# Patient Record
Sex: Male | Born: 1970 | Race: White | Hispanic: Yes | Marital: Married | Smoking: Never smoker
Health system: Southern US, Community
[De-identification: ages and names within clinical notes are randomized; demographics above are authoritative.]

## PROBLEM LIST (undated history)

## (undated) DIAGNOSIS — F419 Anxiety disorder, unspecified: Secondary | ICD-10-CM

## (undated) DIAGNOSIS — R51 Headache: Secondary | ICD-10-CM

## (undated) DIAGNOSIS — Z789 Other specified health status: Secondary | ICD-10-CM

## (undated) HISTORY — PX: NO PAST SURGERIES: SHX2092

---

## 2006-04-01 ENCOUNTER — Emergency Department (HOSPITAL_COMMUNITY): Admission: EM | Admit: 2006-04-01 | Discharge: 2006-04-01 | Payer: Self-pay | Admitting: Emergency Medicine

## 2008-03-07 ENCOUNTER — Emergency Department (HOSPITAL_COMMUNITY): Admission: EM | Admit: 2008-03-07 | Discharge: 2008-03-07 | Payer: Self-pay | Admitting: Emergency Medicine

## 2010-04-11 ENCOUNTER — Emergency Department (HOSPITAL_COMMUNITY): Admission: EM | Admit: 2010-04-11 | Discharge: 2010-04-11 | Payer: Self-pay | Admitting: Emergency Medicine

## 2010-10-06 LAB — DIFFERENTIAL
Lymphocytes Relative: 9 % — ABNORMAL LOW (ref 12–46)
Lymphs Abs: 0.9 10*3/uL (ref 0.7–4.0)
Monocytes Relative: 4 % (ref 3–12)
Neutro Abs: 8.7 10*3/uL — ABNORMAL HIGH (ref 1.7–7.7)
Neutrophils Relative %: 86 % — ABNORMAL HIGH (ref 43–77)

## 2010-10-06 LAB — COMPREHENSIVE METABOLIC PANEL
ALT: 20 U/L (ref 0–53)
CO2: 29 mEq/L (ref 19–32)
Calcium: 9.2 mg/dL (ref 8.4–10.5)
Creatinine, Ser: 0.93 mg/dL (ref 0.4–1.5)
GFR calc Af Amer: >60 mL/min (ref >60)
GFR calc non Af Amer: >60 mL/min (ref >60)
Glucose, Bld: 122 mg/dL — ABNORMAL HIGH (ref 70–99)
Sodium: 136 mEq/L (ref 135–145)
Total Protein: 7.3 g/dL (ref 6.0–8.3)

## 2010-10-06 LAB — CBC
MCV: 89.8 fL (ref 78.0–100.0)
RBC: 4.98 MIL/uL (ref 4.22–5.81)
RDW: 12.8 % (ref 11.5–15.5)
WBC: 10 10*3/uL (ref 4.0–10.5)

## 2010-10-06 LAB — LIPASE, BLOOD: Lipase: 32 U/L (ref 11–59)

## 2011-06-21 ENCOUNTER — Encounter: Payer: Self-pay | Admitting: *Deleted

## 2011-06-21 ENCOUNTER — Emergency Department (HOSPITAL_COMMUNITY)
Admission: EM | Admit: 2011-06-21 | Discharge: 2011-06-21 | Disposition: A | Discharge status: Home / Self Care | Payer: No Typology Code available for payment source | Attending: Emergency Medicine | Admitting: Emergency Medicine

## 2011-06-21 ENCOUNTER — Emergency Department (HOSPITAL_COMMUNITY): Payer: No Typology Code available for payment source

## 2011-06-21 DIAGNOSIS — M545 Low back pain, unspecified: Secondary | ICD-10-CM | POA: Insufficient documentation

## 2011-06-21 DIAGNOSIS — S139XXA Sprain of joints and ligaments of unspecified parts of neck, initial encounter: Secondary | ICD-10-CM | POA: Insufficient documentation

## 2011-06-21 DIAGNOSIS — S161XXA Strain of muscle, fascia and tendon at neck level, initial encounter: Secondary | ICD-10-CM

## 2011-06-21 DIAGNOSIS — M25569 Pain in unspecified knee: Secondary | ICD-10-CM | POA: Insufficient documentation

## 2011-06-21 DIAGNOSIS — M25519 Pain in unspecified shoulder: Secondary | ICD-10-CM | POA: Insufficient documentation

## 2011-06-21 DIAGNOSIS — T07XXXA Unspecified multiple injuries, initial encounter: Secondary | ICD-10-CM | POA: Insufficient documentation

## 2011-06-21 DIAGNOSIS — M542 Cervicalgia: Secondary | ICD-10-CM | POA: Insufficient documentation

## 2011-06-21 MED ORDER — CYCLOBENZAPRINE HCL 10 MG PO TABS: 10.0000 mg | ORAL_TABLET | Freq: Three times a day (TID) | ORAL | Status: AC | PRN

## 2011-06-21 MED ORDER — IBUPROFEN 800 MG PO TABS
800.0000 mg | ORAL_TABLET | Freq: Once | ORAL | Status: AC
  Administered 2011-06-21: 800 mg via ORAL
  Filled 2011-06-21: qty 1

## 2011-06-21 MED ORDER — HYDROCODONE-ACETAMINOPHEN 5-325 MG PO TABS: ORAL_TABLET | ORAL | Status: AC

## 2011-06-21 MED ORDER — OXYCODONE-ACETAMINOPHEN 5-325 MG PO TABS
1.0000 | ORAL_TABLET | Freq: Once | ORAL | Status: AC
  Administered 2011-06-21: 1 via ORAL
  Filled 2011-06-21: qty 1

## 2011-06-21 NOTE — ED Notes (Signed)
Patient remains in xray 
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
## 2011-06-21 NOTE — ED Notes (Signed)
Patient in x ray. Will administer pain medication upon return to ED room.
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
## 2011-06-21 NOTE — ED Notes (Signed)
Patient with no complaints at this time. Respirations even and unlabored. Skin warm/dry. Discharge instructions reviewed with patient at this time. Patient given opportunity to voice concerns/ask questions. Patient discharged at this time and left Emergency Department with steady gait.   

## 2011-06-21 NOTE — ED Notes (Signed)
Patient requesting pain medication. Pauline Aus, PA made aware. Awaiting orders.

## 2011-06-23 NOTE — ED Provider Notes (Signed)
Medical screening examination/treatment/procedure(s) were performed by non-physician practitioner and as supervising physician I was immediately available for consultation/collaboration.   Benny Lennert, MD 06/23/11 [phone]

## 2011-08-24 ENCOUNTER — Encounter (HOSPITAL_COMMUNITY): Payer: Self-pay | Admitting: Emergency Medicine

## 2011-08-24 ENCOUNTER — Emergency Department (HOSPITAL_COMMUNITY)
Admission: EM | Admit: 2011-08-24 | Discharge: 2011-08-24 | Disposition: A | Discharge status: Home / Self Care | Payer: No Typology Code available for payment source | Attending: Emergency Medicine | Admitting: Emergency Medicine

## 2011-08-24 ENCOUNTER — Emergency Department (HOSPITAL_COMMUNITY): Payer: No Typology Code available for payment source

## 2011-08-24 DIAGNOSIS — R0789 Other chest pain: Secondary | ICD-10-CM

## 2011-08-24 DIAGNOSIS — M79609 Pain in unspecified limb: Secondary | ICD-10-CM | POA: Insufficient documentation

## 2011-08-24 DIAGNOSIS — Z87828 Personal history of other (healed) physical injury and trauma: Secondary | ICD-10-CM | POA: Insufficient documentation

## 2011-08-24 DIAGNOSIS — M25519 Pain in unspecified shoulder: Secondary | ICD-10-CM

## 2011-08-24 DIAGNOSIS — R071 Chest pain on breathing: Secondary | ICD-10-CM | POA: Insufficient documentation

## 2011-08-24 MED ORDER — HYDROCODONE-ACETAMINOPHEN 7.5-325 MG PO TABS: 1.0000 | ORAL_TABLET | Freq: Four times a day (QID) | ORAL | Status: AC | PRN

## 2011-08-24 MED ORDER — PREDNISONE 10 MG PO TABS: ORAL_TABLET | ORAL | Status: DC
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
## 2011-08-24 NOTE — ED Notes (Signed)
Return from xray
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
## 2011-08-25 NOTE — ED Provider Notes (Signed)
Medical screening examination/treatment/procedure(s) were performed by non-physician practitioner and as supervising physician I was immediately available for consultation/collaboration.   Glynn Octave, MD 08/25/11 1121

## 2013-03-27 ENCOUNTER — Emergency Department (HOSPITAL_COMMUNITY): Payer: Self-pay

## 2013-03-27 ENCOUNTER — Emergency Department (HOSPITAL_COMMUNITY)
Admission: EM | Admit: 2013-03-27 | Discharge: 2013-03-27 | Disposition: A | Discharge status: Home / Self Care | Payer: Self-pay | Attending: Emergency Medicine | Admitting: Emergency Medicine

## 2013-03-27 ENCOUNTER — Encounter (HOSPITAL_COMMUNITY): Payer: Self-pay | Admitting: Emergency Medicine

## 2013-03-27 DIAGNOSIS — S9782XA Crushing injury of left foot, initial encounter: Secondary | ICD-10-CM

## 2013-03-27 DIAGNOSIS — S9780XA Crushing injury of unspecified foot, initial encounter: Secondary | ICD-10-CM | POA: Insufficient documentation

## 2013-03-27 DIAGNOSIS — W208XXA Other cause of strike by thrown, projected or falling object, initial encounter: Secondary | ICD-10-CM | POA: Insufficient documentation

## 2013-03-27 DIAGNOSIS — Y9289 Other specified places as the place of occurrence of the external cause: Secondary | ICD-10-CM | POA: Insufficient documentation

## 2013-03-27 DIAGNOSIS — Y9389 Activity, other specified: Secondary | ICD-10-CM | POA: Insufficient documentation

## 2013-03-27 MED ORDER — KETOROLAC TROMETHAMINE 10 MG PO TABS
10.0000 mg | ORAL_TABLET | Freq: Once | ORAL | Status: AC
  Administered 2013-03-27: 10 mg via ORAL
  Filled 2013-03-27: qty 1

## 2013-03-27 MED ORDER — OXYCODONE-ACETAMINOPHEN 5-325 MG PO TABS: 1.0000 | ORAL_TABLET | Freq: Four times a day (QID) | ORAL | Status: DC | PRN

## 2013-03-27 MED ORDER — ONDANSETRON HCL 4 MG PO TABS
4.0000 mg | ORAL_TABLET | Freq: Once | ORAL | Status: AC
  Administered 2013-03-27: 4 mg via ORAL
  Filled 2013-03-27: qty 1

## 2013-03-27 MED ORDER — OXYCODONE-ACETAMINOPHEN 5-325 MG PO TABS
1.0000 | ORAL_TABLET | Freq: Once | ORAL | Status: AC
  Administered 2013-03-27: 1 via ORAL
  Filled 2013-03-27: qty 1

## 2013-03-27 NOTE — ED Notes (Addendum)
Patient reports bucket that weighs approximately 300lbs fell from front of tractor onto right foot. Swelling noted.

## 2013-03-27 NOTE — ED Provider Notes (Signed)
CSN: [id number]     Arrival date & time 03/27/13  3086 History   First MD Initiated Contact with Patient 03/27/13 1009     Chief Complaint  Patient presents with  . Foot Injury   (Consider location/radiation/quality/duration/timing/severity/associated sxs/prior Treatment) Patient is a 42 y.o. male presenting with foot injury. The history is provided by the patient.  Foot Injury Location:  Foot Injury: yes (a "300lb" piece of  farm equipment fell on the left foot this morning.)   Mechanism of injury: crush   Crush injury:    Mechanism:  Farm machinery   Duration of crushing force:  2 hours   Approximate weight of object:  300 lbs Foot location:  R foot Pain details:    Quality:  Aching and throbbing   Radiates to:  Does not radiate   Onset quality:  Sudden   Duration:  2 hours   Timing:  Constant   Progression:  Worsening Chronicity:  New Dislocation: no   Foreign body present:  No foreign bodies Prior injury to area:  No Relieved by:  Nothing Worsened by:  Bearing weight Ineffective treatments:  None tried Associated symptoms: swelling and tingling   Associated symptoms: no back pain and no neck pain     History reviewed. No pertinent past medical history. History reviewed. No pertinent past surgical history. History reviewed. No pertinent family history. History  Substance Use Topics  . Smoking status: Never Smoker   . Smokeless tobacco: Not on file  . Alcohol Use: No    Review of Systems  Constitutional: Negative for activity change.       All ROS Neg except as noted in HPI  HENT: Negative for nosebleeds and neck pain.   Eyes: Negative for photophobia and discharge.  Respiratory: Negative for cough, shortness of breath and wheezing.   Cardiovascular: Negative for chest pain and palpitations.  Gastrointestinal: Negative for abdominal pain and blood in stool.  Genitourinary: Negative for dysuria, frequency and hematuria.  Musculoskeletal: Negative for back pain  and arthralgias.  Skin: Negative.   Neurological: Negative for dizziness, seizures and speech difficulty.  Psychiatric/Behavioral: Negative for hallucinations and confusion.    Allergies  Review of patient's allergies indicates no known allergies.  Home Medications   Current Outpatient Rx  Name  Route  Sig  Dispense  Refill  . ibuprofen (ADVIL,MOTRIN) 200 MG tablet   Oral   Take 200 mg by mouth once as needed. For pain         . predniSONE (DELTASONE) 10 MG tablet      Take 6 tablets day one, 5 tablets day two, 4 tablets day three, 3 tablets day four, 2 tablets day five, then 1 tablet day six   21 tablet   0    BP 121/77  Pulse 75  Temp(Src) 98.3 F (36.8 C) (Oral)  Resp 18  Ht 5\' 8"  (1.727 m)  Wt 198 lb (89.812 kg)  BMI 30.11 kg/m2  SpO2 99% Physical Exam  Nursing note and vitals reviewed. Constitutional: He is oriented to person, place, and time. He appears well-developed and well-nourished.  Non-toxic appearance.  HENT:  Head: Normocephalic.  Right Ear: Tympanic membrane and external ear normal.  Left Ear: Tympanic membrane and external ear normal.  Eyes: EOM and lids are normal. Pupils are equal, round, and reactive to light.  Neck: Normal range of motion. Neck supple. Carotid bruit is not present.  Muscle tightness and tenseness from the neck to the upper shoulders.  Cardiovascular: Normal rate, regular rhythm, normal heart sounds, intact distal pulses and normal pulses.   Pulmonary/Chest: Breath sounds normal. No respiratory distress.  Abdominal: Soft. Bowel sounds are normal. There is no tenderness. There is no guarding.  Musculoskeletal: Normal range of motion.  There is Good ROM of the left hip and knee. No tib/fib deformity. Significant swelling of the dorsum of the left foot, and mild to mod swelling of the toes. Achilles intact.   Lymphadenopathy:       Head (right side): No submandibular adenopathy present.       Head (left side): No submandibular  adenopathy present.    He has no cervical adenopathy.  Neurological: He is alert and oriented to person, place, and time. He has normal strength. No cranial nerve deficit or sensory deficit.  Skin: Skin is warm and dry.  Psychiatric: He has a normal mood and affect. His speech is normal.    ED Course  Procedures : BEDSIDE DOPPLER - LEFT LOWER EXTREMITY - Pt identified by arm band. Permission for procedure given by the patient.  Use a bedside doppler, the left DP pulse was identified and followed to the digits. Pt has a biphasic pulse presentation at 76/min. Pt has a triphasic PT pulse presentation. No Temperature changes appreciated. Pt tolerated procedure without problem. Labs Review Labs Reviewed - No data to display Imaging Review No results found.  MDM  No diagnosis found. *I have reviewed nursing notes, vital signs, and all appropriate lab and imaging results for this patient.** Xray of the left foot is negative for fx. Doppler reveals good arterial flow. Pt will elevate the foot, apply ice. He will use ibuprofen and percocet for discomfort. He will return tomorrow morning for recheck. (concern for compartment syndrome) Pt fitted with crutches, and watson-Jones splint and post op shoe.   Kathie Dike, PA-C 03/27/13 1151

## 2013-03-27 NOTE — ED Notes (Signed)
Pt alert & oriented x4, stable gait. Patient given discharge instructions, paperwork & prescription(s). Patient  instructed to stop at the registration desk to finish any additional paperwork. Patient verbalized understanding. Pt left department w/ no further questions. 

## 2013-03-28 ENCOUNTER — Encounter (HOSPITAL_COMMUNITY): Payer: Self-pay | Admitting: Emergency Medicine

## 2013-03-28 ENCOUNTER — Emergency Department (HOSPITAL_COMMUNITY)
Admission: EM | Admit: 2013-03-28 | Discharge: 2013-03-28 | Disposition: A | Discharge status: Home / Self Care | Payer: Self-pay | Attending: Emergency Medicine | Admitting: Emergency Medicine

## 2013-03-28 DIAGNOSIS — S9031XD Contusion of right foot, subsequent encounter: Secondary | ICD-10-CM

## 2013-03-28 DIAGNOSIS — M7989 Other specified soft tissue disorders: Secondary | ICD-10-CM | POA: Insufficient documentation

## 2013-03-28 DIAGNOSIS — Z4789 Encounter for other orthopedic aftercare: Secondary | ICD-10-CM | POA: Insufficient documentation

## 2013-03-28 NOTE — ED Notes (Signed)
Pt had heavy object fall onto right foot yesterday. Here yesterday and advised to come back for recheck today. Bruising and swelling noted to right foot. Pt states it looks a little better. Nad.

## 2013-03-28 NOTE — ED Provider Notes (Signed)
CSN: [id number]     Arrival date & time 03/28/13  5621 History   First MD Initiated Contact with Patient 03/28/13 [id number]     Chief Complaint  Patient presents with  . 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 Pt was evaluated in the Emergency Dept and xrays were negative. Pt has a significant amount of swelling from the crush injury and there was concern for possible developing compartment syndrome. Pt was ask to apply ice and elevate the foot. He was ask to return this AM for recheck. Pt report improvement in pain with the ibuprofen and percocet.  The history is provided by the patient.    History reviewed. No pertinent past medical history. History reviewed. No pertinent past surgical history. History reviewed. No pertinent family history. History  Substance Use Topics  . Smoking status: Never Smoker   . Smokeless tobacco: Not on file  . Alcohol Use: No    Review of Systems  Constitutional: Negative for activity change.       All ROS Neg except as noted in HPI  HENT: Negative for nosebleeds and neck pain.   Eyes: Negative for photophobia and discharge.  Respiratory: Negative for cough, shortness of breath and wheezing.   Cardiovascular: Negative for chest pain and palpitations.  Gastrointestinal: Negative for abdominal pain and blood in stool.  Genitourinary: Negative for dysuria, frequency and hematuria.  Musculoskeletal: Negative for back pain and arthralgias.  Skin: Negative.   Neurological: Negative for dizziness, seizures and speech difficulty.  Psychiatric/Behavioral: Negative for hallucinations and confusion.    Allergies  Review of patient's allergies indicates no known allergies.  Home Medications   Current Outpatient Rx  Name  Route  Sig  Dispense  Refill  . ibuprofen (ADVIL,MOTRIN) 200 MG tablet  Oral   Take 200 mg by mouth once as needed. For pain         . oxyCODONE-acetaminophen (PERCOCET/ROXICET) 5-325 MG per tablet   Oral   Take 1 tablet by mouth every 6 (six) hours as needed for pain.   20 tablet   0    BP 117/79  Pulse 71  Temp(Src) 98 F (36.7 C) (Oral)  Resp 17  SpO2 97% Physical Exam  Nursing note and vitals reviewed. Constitutional: He is oriented to person, place, and time. He appears well-developed and well-nourished.  Non-toxic appearance.  HENT:  Head: Normocephalic.  Right Ear: Tympanic membrane and external ear normal.  Left Ear: Tympanic membrane and external ear normal.  Eyes: EOM and lids are normal. Pupils are equal, round, and reactive to light.  Neck: Normal range of motion. Neck supple. Carotid bruit is not present.  Cardiovascular: Normal rate, regular rhythm, normal heart sounds, intact distal pulses and normal pulses.   Pulmonary/Chest: Breath sounds normal. No respiratory distress.  Abdominal: Soft. Bowel sounds are normal. There is no tenderness. There is no guarding.  Musculoskeletal: Normal range of motion.  The swelling of the right foot is improving. More bruising noted today from yesterday. Mod tenderness of the dorsum of the right foot. FROM of the toes. No sensory deficits. No lesions between the toes. DP and PT pulses wnl. FROM of the right ankle, knee and hip.  Lymphadenopathy:       Head (right side): No submandibular adenopathy present.       Head (left side): No submandibular adenopathy present.    He has no  cervical adenopathy.  Neurological: He is alert and oriented to person, place, and time. He has normal strength. No cranial nerve deficit or sensory deficit.  Skin: Skin is warm and dry.  Psychiatric: He has a normal mood and affect. His speech is normal.    ED Course  Procedures (including critical care time) Labs Review Labs Reviewed - No data to display Imaging Review Dg Foot Complete Right  03/27/2013   *RADIOLOGY  REPORT*  Clinical Data: Pain post trauma  RIGHT FOOT COMPLETE - 3+ VIEW  Comparison: None.  Findings:  Frontal, oblique, and lateral views were obtained. There is no fracture or dislocation.  Joint spaces appear intact. No erosive change.  IMPRESSION: No abnormality noted.   Original Report Authenticated By: Bretta Bang, M.D.   Pulse ox 97% on room air. WNL by my interpretation. MDM  No diagnosis found. **I have reviewed nursing notes, vital signs, and all appropriate lab and imaging results for this patient.*  Xray of the right foot from yesterday is negative for fracture or dislocation.   The swelling of the right foot has iiimproved. The pain is manageable with current pain medication. Pt will apply ice and elevate again today. He will use post op shoe until swelling improves. Work excuse given to return on 9/9. He will return to the ED if any changes or problem.  Kathie Dike, PA-C 03/28/13 [id number]

## 2013-03-28 NOTE — ED Notes (Signed)
Patient with no complaints at this time. Respirations even and unlabored. Skin warm/dry. Discharge instructions reviewed with patient at this time. Patient given opportunity to voice concerns/ask questions. Patient discharged at this time and left Emergency Department with steady gait.   

## 2013-03-30 NOTE — ED Provider Notes (Signed)
Medical screening examination/treatment/procedure(s) were performed by non-physician practitioner and as supervising physician I was immediately available for consultation/collaboration.   Kishia Shackett M Yenesis Even, DO 03/30/13 1158 

## 2013-03-31 NOTE — ED Provider Notes (Signed)
Medical screening examination/treatment/procedure(s) were performed by non-physician practitioner and as supervising physician I was immediately available for consultation/collaboration.    Dailyn Kempner D Joslynne Klatt, MD 03/31/13 1056 

## 2013-10-01 ENCOUNTER — Encounter (HOSPITAL_COMMUNITY): Payer: Self-pay | Admitting: Emergency Medicine

## 2013-10-01 ENCOUNTER — Emergency Department (HOSPITAL_COMMUNITY)
Admission: EM | Admit: 2013-10-01 | Discharge: 2013-10-01 | Disposition: A | Discharge status: Home / Self Care | Payer: Self-pay | Attending: Emergency Medicine | Admitting: Emergency Medicine

## 2013-10-01 DIAGNOSIS — M25519 Pain in unspecified shoulder: Secondary | ICD-10-CM | POA: Insufficient documentation

## 2013-10-01 DIAGNOSIS — M25512 Pain in left shoulder: Secondary | ICD-10-CM

## 2013-10-01 DIAGNOSIS — G8911 Acute pain due to trauma: Secondary | ICD-10-CM | POA: Insufficient documentation

## 2013-10-01 NOTE — Discharge Instructions (Signed)
   Shoulder Pain The shoulder is the joint that connects your arms to your body. The bones that form the shoulder joint include the upper arm bone (humerus), the shoulder blade (scapula), and the collarbone (clavicle). The top of the humerus is shaped like a ball and fits into a rather flat socket on the scapula (glenoid cavity). A combination of muscles and strong, fibrous tissues that connect muscles to bones (tendons) support your shoulder joint and hold the ball in the socket. Small, fluid-filled sacs (bursae) are located in different areas of the joint. They act as cushions between the bones and the overlying soft tissues and help reduce friction between the gliding tendons and the bone as you move your arm. Your shoulder joint allows a wide range of motion in your arm. This range of motion allows you to do things like scratch your back or throw a ball. However, this range of motion also makes your shoulder more prone to pain from overuse and injury. Causes of shoulder pain can originate from both injury and overuse and usually can be grouped in the following four categories:  Redness, swelling, and pain (inflammation) of the tendon (tendinitis) or the bursae (bursitis).  Instability, such as a dislocation of the joint.  Inflammation of the joint (arthritis).  Broken bone (fracture). HOME CARE INSTRUCTIONS   Apply ice to the sore area.  Put ice in a plastic bag.  Place a towel between your skin and the bag.  Leave the ice on for 15-20 minutes, 03-04 times per day for the first 2 days.  Stop using cold packs if they do not help with the pain.  If you have a shoulder sling or immobilizer, wear it as long as your caregiver instructs. Only remove it to shower or bathe. Move your arm as little as possible, but keep your hand moving to prevent swelling.  Squeeze a soft ball or foam pad as much as possible to help prevent swelling.  Only take over-the-counter or prescription medicines for  pain, discomfort, or fever as directed by your caregiver. SEEK MEDICAL CARE IF:   Your shoulder pain increases, or new pain develops in your arm, hand, or fingers.  Your hand or fingers become cold and numb.  Your pain is not relieved with medicines. SEEK IMMEDIATE MEDICAL CARE IF:   Your arm, hand, or fingers are numb or tingling.  Your arm, hand, or fingers are significantly swollen or turn white or blue. MAKE SURE YOU:   Understand these instructions.  Will watch your condition.  Will get help right away if you are not doing well or get worse. Document Released: 04/19/2005 Document Revised: 04/03/2012 Document Reviewed: 06/24/2011 [employer] Patient Information 2014 [employer].  

## 2013-10-01 NOTE — ED Notes (Signed)
Pt c/o left shoulder pain x2 weeks. Pt states he tried to catch himself on fence and injured shoulder. [facility] was [facility] and advised to come here for further evaluation.

## 2013-10-01 NOTE — ED Notes (Signed)
Pt alert & oriented x4, stable gait. Patient given discharge instructions, paperwork & prescription(s). Patient  instructed to stop at the registration desk to finish any additional paperwork. Patient verbalized understanding. Pt left department w/ no further questions. 

## 2013-10-03 NOTE — ED Provider Notes (Signed)
CSN: [id number]     Arrival date & time 10/01/13  1637 History   First MD Initiated Contact with Patient 10/01/13 1813     Chief Complaint  Patient presents with  . Shoulder Pain     (Consider location/radiation/quality/duration/timing/severity/associated sxs/prior Treatment) HPI Comments: Nathaniel Frye is a 43 y.o. Male presenting with pain in his left shoulder, triggered by a slip on ice 2 weeks ago.  He caught himself from falling completely by grabbing a fence with the left arm. This injury occurred while performing his job on a [place].  He is taking ibuprofen which relieves pain temporarily, has alternated ice and heat but continues to have aching pain which is constant but worse with attempts to flex and raise the arm over shoulder height.  He denies weakness or pain in the forearm and hand.  He was [facility], had xrays completed which he was told are negative. According to the referring paperwork from his provider, he was sent here to be evaluated by an orthopedist due to concern about a possible rotator injury.       The history is provided by the patient.    History reviewed. No pertinent past medical history. History reviewed. No pertinent past surgical history. History reviewed. No pertinent family history. History  Substance Use Topics  . Smoking status: Never Smoker   . Smokeless tobacco: Not on file  . Alcohol Use: No    Review of Systems  Constitutional: Negative for fever.  Musculoskeletal: Positive for arthralgias and joint swelling. Negative for myalgias.  Neurological: Negative for weakness and numbness.      Allergies  Review of patient's allergies indicates no known allergies.  Home Medications   Current Outpatient Rx  Name  Route  Sig  Dispense  Refill  . ibuprofen (ADVIL,MOTRIN) 200 MG tablet   Oral   Take 400 mg by mouth as needed for pain. For pain          BP 135/79  Pulse 78  Temp(Src) 98.3  F (36.8 C) (Oral)  Resp 18  SpO2 98% Physical Exam  Constitutional: He appears well-developed and well-nourished.  HENT:  Head: Atraumatic.  Neck: Normal range of motion.  Cardiovascular:  Pulses equal bilaterally  Musculoskeletal: He exhibits tenderness.       Right shoulder: He exhibits bony tenderness and pain. He exhibits no swelling, no effusion, no crepitus, no deformity, no spasm and normal pulse.  Pt has tenderness along the anterior and lateral shoulder joint space,  Pain with attempts to abduct and  flex of the joint.  Negative drop test.  Distal sensation intact,  nontender forearm,  Mid and lower humerus, wrist and hand. No pain or resisted with weakness with elbow flexion.  Radial pulse full.  Less than 3 sec cap refill.  C spine nontender.  Neurological: He is alert. He has normal strength. He displays normal reflexes. No sensory deficit.  Skin: Skin is warm and dry.  Psychiatric: He has a normal mood and affect.    ED Course  Procedures (including critical care time) Labs Review Labs Reviewed - No data to display Imaging Review No results found.   EKG Interpretation None      MDM   Final diagnoses:  Shoulder pain, left    Pt possibly does have a rotator injury, no evidence for full tear.  He was encouraged to continue using ibuprofen, heat.  He was placed in a sling which  did provide comfort - cautioned to maintain ROM.  Referral given to ortho for f/u care.  Pt was not xray'd here,  He presented with a disk of his films from his pcp, but was unable to open it our programs.  Document with patient confirmed normal films.    The patient appears reasonably screened and/or stabilized for discharge and I doubt any other medical condition or other [facility] requiring further screening, evaluation, or treatment in the ED at this time prior to discharge.     Burgess Amor, PA-C 10/03/13 1434

## 2013-10-04 NOTE — ED Provider Notes (Signed)
Medical screening examination/treatment/procedure(s) were performed by non-physician practitioner and as supervising physician I was immediately available for consultation/collaboration.   EKG Interpretation None        Joya Gaskinsonald W Verland Sprinkle, MD 10/04/13 Windell [id number]

## 2013-11-26 ENCOUNTER — Other Ambulatory Visit (HOSPITAL_COMMUNITY): Payer: Self-pay | Admitting: Orthopaedic Surgery

## 2013-11-26 DIAGNOSIS — M25512 Pain in left shoulder: Secondary | ICD-10-CM

## 2013-11-26 DIAGNOSIS — W19XXXA Unspecified fall, initial encounter: Secondary | ICD-10-CM

## 2013-12-01 ENCOUNTER — Ambulatory Visit (HOSPITAL_COMMUNITY)
Admission: RE | Admit: 2013-12-01 | Discharge: 2013-12-01 | Disposition: A | Discharge status: Home / Self Care | Payer: No Typology Code available for payment source | Source: Ambulatory Visit | Attending: Orthopaedic Surgery | Admitting: Orthopaedic Surgery

## 2013-12-01 ENCOUNTER — Ambulatory Visit (HOSPITAL_COMMUNITY): Payer: Self-pay

## 2013-12-01 DIAGNOSIS — W19XXXA Unspecified fall, initial encounter: Secondary | ICD-10-CM | POA: Insufficient documentation

## 2013-12-01 DIAGNOSIS — Y929 Unspecified place or not applicable: Secondary | ICD-10-CM | POA: Insufficient documentation

## 2013-12-01 DIAGNOSIS — M25512 Pain in left shoulder: Secondary | ICD-10-CM

## 2013-12-01 DIAGNOSIS — S43439A Superior glenoid labrum lesion of unspecified shoulder, initial encounter: Secondary | ICD-10-CM | POA: Insufficient documentation

## 2013-12-01 DIAGNOSIS — M67919 Unspecified disorder of synovium and tendon, unspecified shoulder: Secondary | ICD-10-CM | POA: Insufficient documentation

## 2013-12-01 DIAGNOSIS — M719 Bursopathy, unspecified: Secondary | ICD-10-CM | POA: Insufficient documentation

## 2013-12-01 DIAGNOSIS — M259 Joint disorder, unspecified: Secondary | ICD-10-CM | POA: Insufficient documentation

## 2014-01-02 ENCOUNTER — Other Ambulatory Visit: Payer: Self-pay | Admitting: Orthopedic Surgery

## 2014-01-15 ENCOUNTER — Encounter (HOSPITAL_COMMUNITY): Payer: Self-pay

## 2014-01-15 ENCOUNTER — Encounter (HOSPITAL_COMMUNITY)
Admission: RE | Admit: 2014-01-15 | Discharge: 2014-01-15 | Disposition: A | Discharge status: Home / Self Care | Payer: Self-pay | Source: Ambulatory Visit | Attending: Orthopedic Surgery | Admitting: Orthopedic Surgery

## 2014-01-15 DIAGNOSIS — Z01812 Encounter for preprocedural laboratory examination: Secondary | ICD-10-CM | POA: Insufficient documentation

## 2014-01-15 DIAGNOSIS — Z01818 Encounter for other preprocedural examination: Secondary | ICD-10-CM | POA: Insufficient documentation

## 2014-01-15 HISTORY — DX: Headache: R51

## 2014-01-15 HISTORY — DX: Anxiety disorder, unspecified: F41.9

## 2014-01-15 LAB — BASIC METABOLIC PANEL
BUN: 14 mg/dL (ref 6–23)
CALCIUM: 9.1 mg/dL (ref 8.4–10.5)
CO2: 27 mEq/L (ref 19–32)
CREATININE: 0.84 mg/dL (ref 0.50–1.35)
Chloride: 102 mEq/L (ref 96–112)
GFR calc Af Amer: >90 mL/min (ref >90)
Glucose, Bld: 89 mg/dL (ref 70–99)
Potassium: 4.3 mEq/L (ref 3.7–5.3)
SODIUM: 141 meq/L (ref 137–147)

## 2014-01-15 LAB — CBC
HCT: 42.8 % (ref 39.0–52.0)
Hemoglobin: 14.2 g/dL (ref 13.0–17.0)
MCH: 30.3 pg (ref 26.0–34.0)
MCHC: 33.2 g/dL (ref 30.0–36.0)
MCV: 91.5 fL (ref 78.0–100.0)
Platelets: 236 10*3/uL (ref 150–400)
RBC: 4.68 MIL/uL (ref 4.22–5.81)
RDW: 12.9 % (ref 11.5–15.5)
WBC: 7.9 10*3/uL (ref 4.0–10.5)

## 2014-01-15 NOTE — Pre-Procedure Instructions (Signed)
Nathaniel SeminoleJuan Frye  01/15/2014   Your procedure is scheduled on:  01/22/14  [facility] at 11 AM.  Call this number if you have problems the morning of surgery: [phone]   Remember:   Do not eat food or drink liquids after midnight.   Take these medicines the morning of surgery with A SIP OF WATER: none   Do not wear jewelry, make-up or nail polish.  Do not wear lotions, powders, or perfumes. You may wear deodorant.  Do not shave 48 hours prior to surgery. Men may shave face and neck.  Do not bring valuables to the hospital.  [facility] is not responsible                  for any belongings or valuables.               Contacts, dentures or bridgework may not be worn into surgery.  Leave suitcase in the car. After surgery it may be brought to your room.  For patients admitted to the hospital, discharge time is determined by your                treatment team.               Patients discharged the day of surgery will not be allowed to drive  home.  Name and phone number of your driver: family  Special Instructions: Incentive Spirometry - Practice and bring it with you on the day of surgery.   Please read over the following fact sheets that you were given: Pain Booklet, Coughing and Deep Breathing and Surgical Site Infection Prevention

## 2014-01-19 ENCOUNTER — Other Ambulatory Visit: Payer: Self-pay | Admitting: Orthopedic Surgery

## 2014-01-22 ENCOUNTER — Ambulatory Visit (HOSPITAL_COMMUNITY)
Admission: RE | Admit: 2014-01-22 | Payer: No Typology Code available for payment source | Source: Ambulatory Visit | Admitting: Orthopedic Surgery

## 2014-01-22 ENCOUNTER — Encounter (HOSPITAL_COMMUNITY): Admission: RE | Payer: Self-pay | Source: Ambulatory Visit

## 2014-01-22 SURGERY — SHOULDER ARTHROSCOPY WITH DEBRIDEMENT AND BICEP TENDON REPAIR
Anesthesia: Choice | Laterality: Left

## 2014-02-24 ENCOUNTER — Encounter (HOSPITAL_BASED_OUTPATIENT_CLINIC_OR_DEPARTMENT_OTHER): Payer: Self-pay | Admitting: *Deleted

## 2014-02-24 NOTE — Progress Notes (Signed)
Pt speaks very little english-daughter will bring him-have requested a spanish interpreter-no labs needed

## 2014-02-27 NOTE — Pre-Procedure Instructions (Signed)
Renae Fickleaul will be interpreter for pt., per Darl PikesSusan at [facility].

## 2014-03-02 ENCOUNTER — Encounter (HOSPITAL_BASED_OUTPATIENT_CLINIC_OR_DEPARTMENT_OTHER): Payer: BC Managed Care – PPO | Admitting: Anesthesiology

## 2014-03-02 ENCOUNTER — Encounter (HOSPITAL_BASED_OUTPATIENT_CLINIC_OR_DEPARTMENT_OTHER): Payer: Self-pay | Admitting: *Deleted

## 2014-03-02 ENCOUNTER — Encounter (HOSPITAL_BASED_OUTPATIENT_CLINIC_OR_DEPARTMENT_OTHER)
Admission: RE | Disposition: A | Discharge status: Home / Self Care | Payer: Self-pay | Source: Ambulatory Visit | Attending: Orthopedic Surgery

## 2014-03-02 ENCOUNTER — Ambulatory Visit (HOSPITAL_BASED_OUTPATIENT_CLINIC_OR_DEPARTMENT_OTHER): Payer: BC Managed Care – PPO | Admitting: Anesthesiology

## 2014-03-02 ENCOUNTER — Ambulatory Visit (HOSPITAL_BASED_OUTPATIENT_CLINIC_OR_DEPARTMENT_OTHER)
Admission: RE | Admit: 2014-03-02 | Discharge: 2014-03-02 | Disposition: A | Discharge status: Home / Self Care | Payer: BC Managed Care – PPO | Source: Ambulatory Visit | Attending: Orthopedic Surgery | Admitting: Orthopedic Surgery

## 2014-03-02 DIAGNOSIS — S43432D Superior glenoid labrum lesion of left shoulder, subsequent encounter: Secondary | ICD-10-CM

## 2014-03-02 DIAGNOSIS — F411 Generalized anxiety disorder: Secondary | ICD-10-CM | POA: Insufficient documentation

## 2014-03-02 DIAGNOSIS — M24119 Other articular cartilage disorders, unspecified shoulder: Secondary | ICD-10-CM | POA: Insufficient documentation

## 2014-03-02 DIAGNOSIS — Z5333 Arthroscopic surgical procedure converted to open procedure: Secondary | ICD-10-CM | POA: Insufficient documentation

## 2014-03-02 HISTORY — PX: LABRAL REPAIR: SHX5172

## 2014-03-02 HISTORY — DX: Other specified health status: Z78.9

## 2014-03-02 SURGERY — ARTHROSCOPIC LABRAL REPAIR
Anesthesia: General | Site: Shoulder | Laterality: Left

## 2014-03-02 MED ORDER — POVIDONE-IODINE 7.5 % EX SOLN: Freq: Once | CUTANEOUS | Status: DC

## 2014-03-02 MED ORDER — FENTANYL CITRATE 0.05 MG/ML IJ SOLN
INTRAMUSCULAR | Status: AC
  Filled 2014-03-02: qty 2

## 2014-03-02 MED ORDER — ONDANSETRON HCL 4 MG/2ML IJ SOLN: 4.0000 mg | Freq: Once | INTRAMUSCULAR | Status: DC | PRN

## 2014-03-02 MED ORDER — FENTANYL CITRATE 0.05 MG/ML IJ SOLN
INTRAMUSCULAR | Status: DC | PRN
  Administered 2014-03-02 (×2): 25 ug via INTRAVENOUS

## 2014-03-02 MED ORDER — CEFAZOLIN SODIUM-DEXTROSE 2-3 GM-% IV SOLR
2.0000 g | INTRAVENOUS | Status: AC
  Administered 2014-03-02: 2 g via INTRAVENOUS

## 2014-03-02 MED ORDER — ONDANSETRON HCL 4 MG/2ML IJ SOLN
INTRAMUSCULAR | Status: DC | PRN
  Administered 2014-03-02: 4 mg via INTRAVENOUS

## 2014-03-02 MED ORDER — MIDAZOLAM HCL 2 MG/2ML IJ SOLN
1.0000 mg | INTRAMUSCULAR | Status: DC | PRN
  Administered 2014-03-02: 2 mg via INTRAVENOUS

## 2014-03-02 MED ORDER — OXYCODONE HCL 5 MG PO TABS: 5.0000 mg | ORAL_TABLET | Freq: Once | ORAL | Status: DC | PRN

## 2014-03-02 MED ORDER — FENTANYL CITRATE 0.05 MG/ML IJ SOLN
50.0000 ug | INTRAMUSCULAR | Status: DC | PRN
  Administered 2014-03-02: 100 ug via INTRAVENOUS

## 2014-03-02 MED ORDER — FENTANYL CITRATE 0.05 MG/ML IJ SOLN
INTRAMUSCULAR | Status: AC
  Filled 2014-03-02: qty 6

## 2014-03-02 MED ORDER — PROPOFOL 10 MG/ML IV BOLUS
INTRAVENOUS | Status: DC | PRN
  Administered 2014-03-02: 150 mg via INTRAVENOUS

## 2014-03-02 MED ORDER — CEFAZOLIN SODIUM-DEXTROSE 2-3 GM-% IV SOLR
INTRAVENOUS | Status: AC
  Filled 2014-03-02: qty 50

## 2014-03-02 MED ORDER — HYDROMORPHONE HCL PF 1 MG/ML IJ SOLN
0.2500 mg | INTRAMUSCULAR | Status: DC | PRN
  Administered 2014-03-02 (×3): 0.5 mg via INTRAVENOUS

## 2014-03-02 MED ORDER — SUCCINYLCHOLINE CHLORIDE 20 MG/ML IJ SOLN
INTRAMUSCULAR | Status: DC | PRN
  Administered 2014-03-02: 100 mg via INTRAVENOUS

## 2014-03-02 MED ORDER — OXYCODONE HCL 5 MG/5ML PO SOLN: 5.0000 mg | Freq: Once | ORAL | Status: DC | PRN

## 2014-03-02 MED ORDER — SODIUM CHLORIDE 0.9 % IR SOLN
Status: DC | PRN
  Administered 2014-03-02: 3000 mL

## 2014-03-02 MED ORDER — OXYCODONE-ACETAMINOPHEN 5-325 MG PO TABS: 1.0000 | ORAL_TABLET | ORAL | Status: AC | PRN

## 2014-03-02 MED ORDER — LIDOCAINE HCL (CARDIAC) 10 MG/ML IV SOLN
INTRAVENOUS | Status: DC | PRN
  Administered 2014-03-02: 40 mg via INTRAVENOUS

## 2014-03-02 MED ORDER — LACTATED RINGERS IV SOLN
INTRAVENOUS | Status: DC
  Administered 2014-03-02 (×3): via INTRAVENOUS

## 2014-03-02 MED ORDER — DEXAMETHASONE SODIUM PHOSPHATE 4 MG/ML IJ SOLN
INTRAMUSCULAR | Status: DC | PRN
  Administered 2014-03-02: 10 mg via INTRAVENOUS

## 2014-03-02 MED ORDER — CEFAZOLIN SODIUM-DEXTROSE 2-3 GM-% IV SOLR: 2.0000 g | INTRAVENOUS | Status: DC

## 2014-03-02 MED ORDER — DOCUSATE SODIUM 100 MG PO CAPS: 100.0000 mg | ORAL_CAPSULE | Freq: Three times a day (TID) | ORAL | Status: AC | PRN

## 2014-03-02 MED ORDER — HYDROMORPHONE HCL PF 1 MG/ML IJ SOLN
INTRAMUSCULAR | Status: AC
  Filled 2014-03-02: qty 1

## 2014-03-02 MED ORDER — MIDAZOLAM HCL 2 MG/2ML IJ SOLN
INTRAMUSCULAR | Status: AC
  Filled 2014-03-02: qty 2

## 2014-03-02 MED ORDER — MEPERIDINE HCL 25 MG/ML IJ SOLN: 6.2500 mg | INTRAMUSCULAR | Status: DC | PRN

## 2014-03-02 MED ORDER — OXYCODONE HCL 5 MG PO TABS
ORAL_TABLET | ORAL | Status: AC
  Filled 2014-03-02: qty 1

## 2014-03-02 SURGICAL SUPPLY — 71 items
BIT DRILL 4.8MMDIAX5IN DISPOSE (BIT) IMPLANT
BIT DRILL 7/64X5 DISP (BIT) ×2 IMPLANT
BLADE SURG 15 STRL LF DISP TIS (BLADE) IMPLANT
BLADE SURG 15 STRL SS (BLADE) ×3
BUR 3.5 LG SPHERICAL (BURR) IMPLANT
BUR OVAL 4.0 (BURR) IMPLANT
BURR 3.5 LG SPHERICAL (BURR)
BURR 3.5MM LG SPHERICAL (BURR)
CANISTER SUCT 3000ML (MISCELLANEOUS) IMPLANT
CANNULA 5.75X71 LONG (CANNULA) ×3 IMPLANT
CANNULA TWIST IN 8.25X7CM (CANNULA) IMPLANT
CHLORAPREP W/TINT 26ML (MISCELLANEOUS) ×3 IMPLANT
DECANTER SPIKE VIAL GLASS SM (MISCELLANEOUS) IMPLANT
DRAPE INCISE IOBAN 66X45 STRL (DRAPES) ×3 IMPLANT
DRAPE STERI 35X30 U-POUCH (DRAPES) ×3 IMPLANT
DRAPE SURG 17X23 STRL (DRAPES) ×3 IMPLANT
DRAPE U 20/CS (DRAPES) ×3 IMPLANT
DRAPE U-SHAPE 47X51 STRL (DRAPES) ×3 IMPLANT
DRAPE U-SHAPE 76X120 STRL (DRAPES) ×6 IMPLANT
DRILL BIT 4.8MMDIAX5IN DISPOSE (BIT) ×3
DRSG PAD ABDOMINAL 8X10 ST (GAUZE/BANDAGES/DRESSINGS) IMPLANT
ELECT BLADE 4.0 EZ CLEAN MEGAD (MISCELLANEOUS) ×3
ELECT REM PT RETURN 9FT ADLT (ELECTROSURGICAL) ×3
ELECTRODE BLDE 4.0 EZ CLN MEGD (MISCELLANEOUS) IMPLANT
ELECTRODE REM PT RTRN 9FT ADLT (ELECTROSURGICAL) ×1 IMPLANT
GAUZE SPONGE 4X4 12PLY STRL (GAUZE/BANDAGES/DRESSINGS) ×3 IMPLANT
GAUZE SPONGE 4X4 16PLY XRAY LF (GAUZE/BANDAGES/DRESSINGS) IMPLANT
GAUZE XEROFORM 1X8 LF (GAUZE/BANDAGES/DRESSINGS) ×3 IMPLANT
GLOVE BIO SURGEON STRL SZ7 (GLOVE) ×3 IMPLANT
GLOVE BIO SURGEON STRL SZ7.5 (GLOVE) ×3 IMPLANT
GLOVE BIOGEL PI IND STRL 7.0 (GLOVE) ×1 IMPLANT
GLOVE BIOGEL PI IND STRL 8 (GLOVE) ×1 IMPLANT
GLOVE BIOGEL PI INDICATOR 7.0 (GLOVE) ×4
GLOVE BIOGEL PI INDICATOR 8 (GLOVE) ×2
GLOVE ECLIPSE 6.5 STRL STRAW (GLOVE) ×6 IMPLANT
GLOVE SURG SS PI 7.0 STRL IVOR (GLOVE) ×2 IMPLANT
GOWN STRL REUS W/ TWL LRG LVL3 (GOWN DISPOSABLE) ×2 IMPLANT
GOWN STRL REUS W/ TWL XL LVL3 (GOWN DISPOSABLE) IMPLANT
GOWN STRL REUS W/TWL LRG LVL3 (GOWN DISPOSABLE) ×9
GOWN STRL REUS W/TWL XL LVL3 (GOWN DISPOSABLE) ×3
GOWN STRL REUS W/TWL XL LVL4 (GOWN DISPOSABLE) ×1 IMPLANT
IV NS IRRIG 3000ML ARTHROMATIC (IV SOLUTION) ×6 IMPLANT
KIT BIO-SUTURETAK 2.4 SPR TROC (KITS) IMPLANT
LASSO CRESCENT QUICKPASS (SUTURE) IMPLANT
MANIFOLD NEPTUNE II (INSTRUMENTS) ×3 IMPLANT
NS IRRIG 1000ML POUR BTL (IV SOLUTION) IMPLANT
PACK ARTHROSCOPY DSU (CUSTOM PROCEDURE TRAY) ×3 IMPLANT
PACK BASIN DAY SURGERY FS (CUSTOM PROCEDURE TRAY) ×3 IMPLANT
PENCIL BUTTON HOLSTER BLD 10FT (ELECTRODE) ×2 IMPLANT
RESECTOR FULL RADIUS 4.2MM (BLADE) ×3 IMPLANT
SHEET MEDIUM DRAPE 40X70 STRL (DRAPES) IMPLANT
SLEEVE SCD COMPRESS KNEE MED (MISCELLANEOUS) ×3 IMPLANT
SLING ARM IMMOBILIZER MED (SOFTGOODS) IMPLANT
SLING ARM LRG ADULT FOAM STRAP (SOFTGOODS) IMPLANT
SLING ARM MED ADULT FOAM STRAP (SOFTGOODS) IMPLANT
SLING ARM XL FOAM STRAP (SOFTGOODS) IMPLANT
SPONGE LAP 4X18 X RAY DECT (DISPOSABLE) ×2 IMPLANT
SUPPORT WRAP ARM LG (MISCELLANEOUS) IMPLANT
SUT 2 FIBERLOOP 20 STRT BLUE (SUTURE) ×3
SUT ETHILON 3 0 PS 1 (SUTURE) ×3 IMPLANT
SUT ETHILON 4 0 PS 2 18 (SUTURE) IMPLANT
SUT MNCRL AB 3-0 PS2 18 (SUTURE) IMPLANT
SUT TIGER TAPE 7 IN WHITE (SUTURE) IMPLANT
SUTURE 2 FIBERLOOP 20 STRT BLU (SUTURE) IMPLANT
TOWEL OR 17X24 6PK STRL BLUE (TOWEL DISPOSABLE) ×3 IMPLANT
TOWEL OR NON WOVEN STRL DISP B (DISPOSABLE) ×3 IMPLANT
TUBE CONNECTING 20'X1/4 (TUBING) ×1
TUBE CONNECTING 20X1/4 (TUBING) ×2 IMPLANT
TUBING ARTHROSCOPY IRRIG 16FT (MISCELLANEOUS) ×3 IMPLANT
WATER STERILE IRR 1000ML POUR (IV SOLUTION) ×3 IMPLANT
YANKAUER SUCT BULB TIP NO VENT (SUCTIONS) ×2 IMPLANT

## 2014-03-02 NOTE — Anesthesia Procedure Notes (Addendum)
Anesthesia Regional Block:  Interscalene brachial plexus block  Pre-Anesthetic Checklist: ,, timeout performed, Correct Patient, Correct Site, Correct Laterality, Correct Procedure, Correct Position, site marked, Risks and benefits discussed,  Surgical consent,  Pre-op evaluation,  At surgeon's request and post-op pain management  Laterality: Left  Prep: chloraprep       Needles:  Injection technique: Single-shot  Needle Type: Echogenic Stimulator Needle     Needle Length: 9cm 9 cm Needle Gauge: 21 and 21 G    Additional Needles:  Procedures: ultrasound guided (picture in chart) and nerve stimulator Interscalene brachial plexus block  Nerve Stimulator or Paresthesia:  Response: 0.4 mA,   Additional Responses:   Narrative:  Start time: 03/02/2014 12:25 PM End time: 03/02/2014 12:35 PM Injection made incrementally with aspirations every 5 mL. Anesthesiologist: Arta BruceKevin Ossey MD  Additional Notes: Monitors applied. Patient sedated. Sterile prep and drape,hand hygiene and sterile gloves were used. Relevant anatomy identified.Needle position confirmed.Local anesthetic injected incrementally after negative aspiration. Local anesthetic spread visualized around nerve(s). Vascular puncture avoided. No complications. Image printed for medical record.The patient tolerated the procedure well.        Procedure Name: Intubation Date/Time: 03/02/2014 1:39 PM Performed by: Gar GibbonKEETON, Soloman Mckeithan S Pre-anesthesia Checklist: Patient identified, Emergency Drugs available, Suction available and Patient being monitored Patient Re-evaluated:Patient Re-evaluated prior to inductionOxygen Delivery Method: Circle System Utilized Preoxygenation: Pre-oxygenation with 100% oxygen Intubation Type: IV induction Ventilation: Mask ventilation without difficulty Laryngoscope Size: Miller and 3 Grade View: Grade III Tube type: Oral Tube size: 8.0 mm Number of attempts: 1 Airway Equipment and Method: stylet and  oral airway Placement Confirmation: ETT inserted through vocal cords under direct vision,  positive ETCO2 and breath sounds checked- equal and bilateral Secured at: 22 cm Tube secured with: Tape Dental Injury: Teeth and Oropharynx as per pre-operative assessment

## 2014-03-02 NOTE — Op Note (Signed)
Procedure(s): ARTHROSCOPIC LABRAL REPAIR Procedure Note  Nathaniel BreedJuan Frye male 43 y.o. 03/02/2014  Procedure(s) and Anesthesia Type:    #1 arthroscopic extensive debridement of superior and posterior labral tear left shoulder    #2 left shoulder open subpectoral biceps tenodesis  Surgeon(s) and Role:    * Mable ParisJustin William Chanci Ojala, MD - Primary     Surgeon: Mable ParisHANDLER,Cameshia Cressman WILLIAM   Assistants: Damita Lackanielle Lalibert PA-C [facility](Danielle was present and scrubbed throughout the procedure and was essential in positioning, assisting with the camera and instrumentation,, and closure)  Anesthesia: General endotracheal anesthesia with preoperative interscalene block given by the attending anesthesiologist     Procedure Detail  ARTHROSCOPIC LABRAL REPAIR  Estimated Blood Loss: Min         Drains: none  Blood Given: none         Specimens: none        Complications:  * No complications entered in OR log *         Disposition: PACU - hemodynamically stable.         Condition: stable    Procedure:   INDICATIONS FOR SURGERY: The patient is 43 y.o. male who injured his left shoulder in February. He was found to have extensive tearing of the superior into the posterior labrum. After failing conservative management he was indicated for operative treatment to try and decrease pain and restore function.  OPERATIVE FINDINGS: Examination under anesthesia: One plus posterior laxity, symmetric to contralateral side.   Diagnostic Arthroscopy:  Glenoid articular cartilage: Intact Humeral head articular cartilage: Intact Labrum: Extensive tearing involving the entire superior labrum with detachment biceps anchor extending around posteriorly to about the 8:00 position. This was essentially a bucket handle type tear. The torn labrum was debrided, biceps was tenotomized and subsequently tenodesed subpectorally Loose bodies: None Synovitis: Moderate Articular sided rotator cuff: Intact   DESCRIPTION  OF PROCEDURE: The patient was identified in preoperative  holding area where I personally marked the operative site after  verifying site, side, and procedure with the patient. An interscalene block was given by the attending anesthesiologist the holding area.  The patient was taken back to the operating room where general anesthesia was induced without complication and was placed in the beach-chair position with the back  elevated about 60 degrees and all extremities and head and neck carefully padded and  positioned.   The left upper extremity was then prepped and  draped in a standard sterile fashion. The appropriate time-out  procedure was carried out. The patient did receive IV antibiotics  within 30 minutes of incision.   A small high lateral posterior portal incision was made and the arthroscope was introduced into the joint. An anterior portal was then established above the subscapularis using needle localization. Small cannula was placed anteriorly. Diagnostic arthroscopy was then carried out with findings as described above.  he was noted to have extensive tearing of the superior labrum extending around the posterior labrum. There is anterior flap of labrum which extended from the base of the biceps around to about the 8:00 position posteriorly which was essentially a bucket handle tear which was completely displaced into the joint. After verification biceps anchor was completely detached a large biter was used through the anterior portal to release the biceps tendon off of the superior labrum. The bucket-handle tear was then released anteriorly in the shaver was used to resect the portion of the tear which was subluxating into the joint. The peripheral rim of posterior labrum and posterior superior  labrum was carefully examined. There is no frank detachment of this and the peripheral attachment remained intact with no communication with a cyst that I could appreciate.   Attention was  then turned to the axilla where a approximately 3 cm incision was made in the dominant axillary fold. This was about 50% above and 50% below the palpable lower border of the pectoralis major. Dissection was carried out between the lower border of the pectoralis major and the short head of the biceps muscle belly. The anterior humerus was then exposed and the long head biceps was delivered out through the wound. The biceps was prepared using a #2 FiberWire fiber loop and the remaining portion of the biceps tendon was discarded after choosing the appropriate tension and length. A drill bit slightly smaller than the tendon was used in the distal bicipital groove to create an intramedullary hole and then a drill bit about 12 mm distal to that was used which was slightly larger than the suture passer needle. A crescent suture lasso was then used to pass the sutures from proximal to distal and then one suture was brought around medial and lateral to the tendon. It was tensioned, dunking the tendon into the intramedullary canal and tied over the anterior portion of the tendon. The tension was felt to be appropriate. The wound was copiously irrigated with normal saline and subsequently closed in layers with 2-0 Vicryl in the deep dermal layer and Dermabond for skin closure.  The portals were closed with 3-0 nylon in an interrupted fashion. Sterile dressings were then applied including Xeroform 4 x 4's ABDs and tape. The patient was then allowed to awaken from general anesthesia, placed in a sling, transferred to the stretcher and taken to the recovery room in stable condition.   POSTOPERATIVE PLAN: The patient will be discharged home today and will followup in one week for suture removal and wound check.  He will follow the standard tenodesis protocol.

## 2014-03-02 NOTE — Discharge Instructions (Signed)
Discharge Instructions after Arthroscopic Shoulder Surgery   A sling has been provided for you. You may remove the sling after 72 hours. The sling may be worn for your protection, if you are in a crowd.  Use ice on the shoulder intermittently over the first 48 hours after surgery.  Pain medication has been prescribed for you.  Use your medication liberally over the first 48 hours, and then begin to taper your use. You may take Extra Strength Tylenol or Tylenol only in place of the pain pills. DO NOT take ANY nonsteroidal anti-inflammatory pain medications: Advil, Motrin, Ibuprofen, Aleve, Naproxen, or Naprosyn.  You may remove your dressing after two days.  You may shower 5 days after surgery. The incision CANNOT get wet prior to 5 days. Simply allow the water to wash over the site and then pat dry. Do not rub the incision. Make sure your axilla (armpit) is completely dry after showering.  Take one aspirin a day for 2 weeks after surgery, unless you have an aspirin sensitivity/allergy or asthma.  Three to 5 times each day you should perform assisted overhead reaching and external rotation (outward turning) exercises with the operative arm. Both exercises should be done with the non-operative arm used as the "therapist arm" while the operative arm remains relaxed. Ten of each exercise should be done three to five times each day. No elbow flexion against resistance for 6 weeks after surgery    Overhead reach is helping to lift your stiff arm up as high as it will go. To stretch your overhead reach, lie flat on your back, relax, and grasp the wrist of the tight shoulder with your opposite hand. Using the power in your opposite arm, bring the stiff arm up as far as it is comfortable. Start holding it for ten seconds and then work up to where you can hold it for a count of 30. Breathe slowly and deeply while the arm is moved. Repeat this stretch ten times, trying to help the arm up a little higher each  time.       External rotation is turning the arm out to the side while your elbow stays close to your body. External rotation is best stretched while you are lying on your back. Hold a cane, yardstick, broom handle, or dowel in both hands. Bend both elbows to a right angle. Use steady, gentle force from your normal arm to rotate the hand of the stiff shoulder out away from your body. Continue the rotation as far as it will go comfortably, holding it there for a count of 10. Repeat this exercise ten times.     Please call [phone] during normal business hours or [phone] after hours for any problems. Including the following:  - excessive redness of the incisions - drainage for more than 4 days - fever of more than 101.5 F  *Please note that pain medications will not be refilled after hours or on weekends.    Post Anesthesia Home Care Instructions  Activity: Get plenty of rest for the remainder of the day. A responsible adult should stay with you for 24 hours following the procedure.  For the next 24 hours, DO NOT: -Drive a car -Advertising copywriterperate machinery -Drink alcoholic beverages -Take any medication unless instructed by your physician -Make any legal decisions or sign important papers.  Meals: Start with liquid foods such as gelatin or soup. Progress to regular foods as tolerated. Avoid greasy, spicy, heavy foods. If nausea and/or vomiting occur, drink  only clear liquids until the nausea and/or vomiting subsides. Call your physician if vomiting continues.  Special Instructions/Symptoms: Your throat may feel dry or sore from the anesthesia or the breathing tube placed in your throat during surgery. If this causes discomfort, gargle with warm salt water. The discomfort should disappear within 24 hours.   Regional Anesthesia Blocks  1. Numbness or the inability to move the "blocked" extremity may last from 3-48 hours after placement. The length of time depends on the medication  injected and your individual response to the medication. If the numbness is not going away after 48 hours, call your surgeon.  2. The extremity that is blocked will need to be protected until the numbness is gone and the  Strength has returned. Because you cannot feel it, you will need to take extra care to avoid injury. Because it may be weak, you may have difficulty moving it or using it. You may not know what position it is in without looking at it while the block is in effect.  3. For blocks in the legs and feet, returning to weight bearing and walking needs to be done carefully. You will need to wait until the numbness is entirely gone and the strength has returned. You should be able to move your leg and foot normally before you try and bear weight or walk. You will need someone to be with you when you first try to ensure you do not fall and possibly risk injury.  4. Bruising and tenderness at the needle site are common side effects and will resolve in a few days.  5. Persistent numbness or new problems with movement should be communicated to the surgeon or the [facility] [phone] [facility] [phone]).

## 2014-03-02 NOTE — Progress Notes (Signed)
AssistedDr. Ossey with left, ultrasound guided, interscalene  block. Side rails up, monitors on throughout procedure. See vital signs in flow sheet. Tolerated Procedure well.  

## 2014-03-02 NOTE — Anesthesia Postprocedure Evaluation (Signed)
  Anesthesia Post-op Note  Patient: Park BreedJuan Aguilar  Procedure(s) Performed: Procedure(s) with comments: Arthroscopy Left Shoulder with Open biceps tenodesis (Left) - Left shoulder arthroscopy debridment labral tear, biceps tenotomy, open biceps tenodesis  Patient Location: PACU  Anesthesia Type:General and block  Level of Consciousness: awake and alert   Airway and Oxygen Therapy: Patient Spontanous Breathing  Post-op Pain: moderate  Post-op Assessment: Post-op Vital signs reviewed, Patient's Cardiovascular Status Stable and Respiratory Function Stable  Post-op Vital Signs: Reviewed  Filed Vitals:   03/02/14 1600  BP: 132/79  Pulse: 82  Temp:   Resp: 10    Complications: No apparent anesthesia complications

## 2014-03-02 NOTE — Anesthesia Preprocedure Evaluation (Signed)
Anesthesia Evaluation  Patient identified by MRN, date of birth, ID band Patient awake    Reviewed: Allergy & Precautions, H&P , NPO status , Patient's Chart, lab work & pertinent test results  Airway Mallampati: I  TM Distance: >3 FB Neck ROM: Full    Dental   Pulmonary          Cardiovascular     Neuro/Psych    GI/Hepatic   Endo/Other    Renal/GU      Musculoskeletal   Abdominal   Peds  Hematology   Anesthesia Other Findings   Reproductive/Obstetrics                             Anesthesia Physical Anesthesia Plan  ASA: I  Anesthesia Plan: General   Post-op Pain Management:    Induction: Intravenous  Airway Management Planned: Oral ETT  Additional Equipment:   Intra-op Plan:   Post-operative Plan: Extubation in OR  Informed Consent: I have reviewed the patients History and Physical, chart, labs and discussed the procedure including the risks, benefits and alternatives for the proposed anesthesia with the patient or authorized representative who has indicated his/her understanding and acceptance.     Plan Discussed with: CRNA and Surgeon  Anesthesia Plan Comments:         Anesthesia Quick Evaluation  

## 2014-03-02 NOTE — Transfer of Care (Signed)
Immediate Anesthesia Transfer of Care Note  Patient: Nathaniel Frye  Procedure(s) Performed: Procedure(s) with comments: ARTHROSCOPIC LABRAL REPAIR (Left) - Left shoulder arthroscopy debridment labral tear, biceps tenotomy, open biceps tenodesis  Patient Location: PACU  Anesthesia Type:GA combined with regional for post-op pain  Level of Consciousness: awake, sedated and unresponsive  Airway & Oxygen Therapy: Patient Spontanous Breathing and Patient connected to face mask oxygen  Post-op Assessment: Report given to PACU RN and Post -op Vital signs reviewed and stable  Post vital signs: Reviewed and stable  Complications: No apparent anesthesia complications

## 2014-03-02 NOTE — H&P (Signed)
Nathaniel Frye is an 43 y.o. male.   Chief Complaint: L shoulder injury HPI: L shoulder injury with SLAP tear  Past Medical History  Diagnosis Date  . Headache(784.0)   . Anxiety   . Medical history non-contributory     Past Surgical History  Procedure Laterality Date  . No past surgeries      History reviewed. No pertinent family history. Social History:  reports that he has never smoked. He does not have any smokeless tobacco history on file. He reports that he drinks alcohol. He reports that he does not use illicit drugs.  Allergies: No Known Allergies  Medications Prior to Admission  Medication Sig Dispense Refill  . ibuprofen (ADVIL,MOTRIN) 200 MG tablet Take 400 mg by mouth every 6 (six) hours as needed for moderate pain.      . Multiple Vitamin (MULTIVITAMIN WITH MINERALS) TABS tablet Take 1 tablet by mouth daily.      Marland Kitchen. PARoxetine (PAXIL) 20 MG tablet Take 20 mg by mouth at bedtime.        No results found for this or any previous visit (from the past 48 hour(s)). No results found.  Review of Systems  All other systems reviewed and are negative.   Blood pressure 158/69, pulse 76, temperature 98.4 F (36.9 C), temperature source Oral, resp. rate 19, height 5' 6.5" (1.689 m), weight 92.08 kg (203 lb), SpO2 99.00%. Physical Exam  Constitutional: He is oriented to person, place, and time. He appears well-developed and well-nourished.  HENT:  Head: Atraumatic.  Eyes: EOM are normal.  Cardiovascular: Intact distal pulses.   Respiratory: Effort normal.  Musculoskeletal:  L shoulder pain with Obriens testing. NVID  Neurological: He is alert and oriented to person, place, and time.  Skin: Skin is warm and dry.  Psychiatric: He has a normal mood and affect.     Assessment/Plan L SLAP tear. Plan arth debride, tenotomy, biceps tenodesis Risks / benefits of surgery discussed Consent on chart  NPO for OR Preop antibiotics   Nathaniel Frye WILLIAM 03/02/2014, 1:02  PM

## 2014-03-04 ENCOUNTER — Encounter (HOSPITAL_BASED_OUTPATIENT_CLINIC_OR_DEPARTMENT_OTHER): Payer: Self-pay | Admitting: Orthopedic Surgery

## 2014-03-11 ENCOUNTER — Ambulatory Visit (HOSPITAL_COMMUNITY)
Admission: RE | Admit: 2014-03-11 | Discharge: 2014-03-11 | Disposition: A | Discharge status: Home / Self Care | Payer: Worker's Compensation | Source: Ambulatory Visit | Attending: Orthopedic Surgery | Admitting: Orthopedic Surgery

## 2014-03-11 DIAGNOSIS — M25512 Pain in left shoulder: Secondary | ICD-10-CM

## 2014-03-11 DIAGNOSIS — R29898 Other symptoms and signs involving the musculoskeletal system: Secondary | ICD-10-CM | POA: Insufficient documentation

## 2014-03-11 DIAGNOSIS — IMO0001 Reserved for inherently not codable concepts without codable children: Secondary | ICD-10-CM | POA: Insufficient documentation

## 2014-03-11 DIAGNOSIS — M25612 Stiffness of left shoulder, not elsewhere classified: Secondary | ICD-10-CM

## 2014-03-11 DIAGNOSIS — M25519 Pain in unspecified shoulder: Secondary | ICD-10-CM | POA: Insufficient documentation

## 2014-03-11 DIAGNOSIS — M25529 Pain in unspecified elbow: Secondary | ICD-10-CM | POA: Insufficient documentation

## 2014-03-11 DIAGNOSIS — M6281 Muscle weakness (generalized): Secondary | ICD-10-CM | POA: Insufficient documentation

## 2014-03-11 DIAGNOSIS — M25619 Stiffness of unspecified shoulder, not elsewhere classified: Secondary | ICD-10-CM | POA: Insufficient documentation

## 2014-03-11 NOTE — Evaluation (Signed)
Occupational Therapy Evaluation  Patient Details  Name: Nathaniel Frye MRN: [id number] Date of Birth: 11/16/1970  Today's Date: 03/11/2014 Time: 1020-1055 OT Time Calculation (min): 35 min Eval 35'  Visit#: 1 of 161  Re-eval: 04/08/14  Assessment Diagnosis: s/p L bicep tenodesis Surgical Date: 03/02/14 Next MD Visit: Hal Morales  Authorization: [employer]  Authorization Time Period:    Authorization Visit#:   of     Past Medical History:  Past Medical History  Diagnosis Date  . Headache(784.0)   . Anxiety   . Medical history non-contributory    Past Surgical History:  Past Surgical History  Procedure Laterality Date  . No past surgeries    . Labral repair Left 03/02/2014    Procedure: Arthroscopy Left Shoulder with Open biceps tenodesis;  Surgeon: Mable Paris, MD;  Location: [facility];  Service: Orthopedics;  Laterality: Left;  Left shoulder arthroscopy debridment labral tear, biceps tenotomy, open biceps tenodesis    Subjective Symptoms/Limitations Symptoms: "Its fine as long as I don't move it." Pertinent History: Pt is 43 yo male presenting to outpatient OT s/p left bicep tenodesis.  Pt fell at work in February on some ice, wrenching his shoulder.  he waited for 3 months to see if it would heal on its own, but the pain only increased, also causing difficulty sleeping.  He had surgery on 03/02/14. Limitations: Dr. Ave Filter - No elbow flexion against resistance, OK for full elbow ROM, full shoulder AROM/PROM Special Tests: FOTO 33/100 Patient Stated Goals: make it better Pain Assessment Currently in Pain?: No/denies  Precautions/Restrictions  Precautions Precaution Comments: No elbow flexion against resistance, OK for full elbow ROM, full shoulder AROM/PROM  Balance Screening Balance Screen Has the patient fallen in the past 6 months: Yes  Prior Functioning  Prior Function Level of Independence: Independent with basic ADLs;Independent with  homemaking with ambulation Driving: Yes (Not currently) Vocation: Full time employment Vocation Requirements: a cow farm in [place]  Assessment ADL/Vision/Perception ADL ADL Comments: Pt is currently requriesing assist iwth all ADLs/IADLs.  His wife is assisting wtih bathing and dressing (pt cannot get incision wet yet). Pt requries assist to don shirt (but can don pants).  Pt is not currelty assisting with IADLs at home due to not using LUE. Dominant Hand: Right Vision - History Baseline Vision: No visual deficits  Cognition/Observation Cognition Overall Cognitive Status: Within Functional Limits for tasks assessed Arousal/Alertness: Awake/alert Orientation Level: Oriented X4 Observation/Other Assessments Observations: Pt has 1 2-inch scar along upper bicep, and 2 small, nearly healed incisions on anterior and posterior shoulder.  Sensation/Coordination/Edema Sensation Additional Comments: Pt reports occassional numbness along dorsal aspect of fingers. at night, pt has shock-like feeling along incision. Coordination Gross Motor Movements are Fluid and Coordinated: Not tested Fine Motor Movements are Fluid and Coordinated: Yes  Additional Assessments LUE AROM (degrees) LUE Overall AROM Comments: Pt assessed in supine, eith ER/IR shoulder adducted Left Shoulder Flexion: 106 Degrees Left Shoulder ABduction: 100 Degrees Left Shoulder Internal Rotation: 93 Degrees Left Shoulder External Rotation: 36 Degrees Left Elbow Flexion: 46 Left Elbow Extension:  (+4) LUE PROM (degrees) LUE Overall PROM Comments: Pt assess in supine, with ER/IR shoulder adducted Left Shoulder Flexion: 136 Degrees Left Shoulder ABduction: 83 Degrees Left Shoulder Internal Rotation: 90 Degrees Left Shoulder External Rotation: 33 Degrees Left Elbow Flexion: 45 Left Elbow Extension:  (+3) LUE Strength LUE Overall Strength Comments: Strenght testing not competed this session due to recent surgery,  decreased ROM, and increased pain with movement Palpation  Palpation: Min fascial restrictions in L upper arm and bicep regions.  Moderate fascial restrictions in mid trap region     Occupational Therapy Assessment and Plan OT Assessment and Plan Clinical Impression Statement: Pt is presenting to outpatient OT with decreased ROM, decreaed strength, incresed pain, and increased fascial restrictions, limiting pt's engagement in ADL, IADL, work, and leisure tasks. Pt will benefit from skilled therapeutic intervention in order to improve on the following deficits: Decreased coordination;Increased fascial restricitons;Pain;Impaired UE functional use;Decreased strength;Decreased range of motion Rehab Potential: Excellent OT Frequency: Min 2X/week OT Duration: 8 weeks OT Treatment/Interventions: Self-care/ADL training;Therapeutic exercise;DME and/or AE instruction;Manual therapy;Patient/family education;Therapeutic activities;Modalities OT Plan: P: Pt will benefit from skilled OT services to increase ROM, increase strength, decrease fascial restrictions, and improve overall LUE functional use.      Treatment Plan: myofascial release and manual stretching, PROM, AAROM, AROM, scapular stabilization, proximal shoulder strengthening, general strengthening   Goals Home Exercise Program Pt/caregiver will Perform Home Exercise Program: For increased ROM;For increased strengthening PT Goal: Perform Home Exercise Program - Progress: Goal set today Short Term Goals Time to Complete Short Term Goals: 4 weeks Short Term Goal 1: Pt will be educated on HEP Short Term Goal 2: Pt will increase LUE PROM to [facility] to improved ability to dress and bathe self Short Term Goal 3: Pt will decrease fascial restrictions to mininimal overall for decrased pain with movement Short Term Goal 4: Pt will have pain of less than 4/10 during driving tasks. Long Term Goals Time to Complete Long Term Goals: 8 weeks Long Term Goal 1: Pt  will achieve highest level of functioning in all ADL, IADL, work, and leisure tasks. Long Term Goal 2: Pt will increase LUE AROM to [facility] for increased independence in dressing and bathing himself. Long Term Goal 3: Pt will achieve strength in LUE of atleast 4+/5 for abilty to engage in all work related activites. Long Term Goal 4: Pt will decrease fascial restrictions to trace for decresed pain with movement Long Term Goal 5: Pt will have pain of less than 2/10 during daily activites.  Problem List Patient Active Problem List   Diagnosis Date Noted  . Pain in joint, shoulder region 03/11/2014  . Shoulder weakness 03/11/2014  . Decreased range of motion of left shoulder 03/11/2014    End of Session Activity Tolerance: Patient tolerated treatment well General Behavior During Therapy: [facility] for tasks assessed/performed OT Plan of Care OT Home Exercise Plan: towel slides OT Patient Instructions: expaliend and demonstrated. Handout provided (scanned). Pt verbalized understanding Consulted and Agree with Plan of Care: Patient  GO    Marry GuanMarie Rawlings, MS, OTR/L [phone]  03/11/2014, 12:09 PM  Physician Documentation Your signature is required to indicate approval of the treatment plan as stated above.  Please sign and either send electronically or make a copy of this report for your files and return this physician signed original.  Please mark one 1.__approve of plan  2. ___approve of plan with the following conditions.   ______________________________                                                          _____________________ Physician Signature  Date  

## 2014-03-13 ENCOUNTER — Ambulatory Visit (HOSPITAL_COMMUNITY)
Admission: RE | Admit: 2014-03-13 | Discharge: 2014-03-13 | Disposition: A | Discharge status: Home / Self Care | Payer: Worker's Compensation | Source: Ambulatory Visit | Attending: Orthopedic Surgery | Admitting: Orthopedic Surgery

## 2014-03-13 ENCOUNTER — Ambulatory Visit (HOSPITAL_COMMUNITY): Payer: BC Managed Care – PPO

## 2014-03-13 DIAGNOSIS — M25512 Pain in left shoulder: Secondary | ICD-10-CM

## 2014-03-13 DIAGNOSIS — R29898 Other symptoms and signs involving the musculoskeletal system: Secondary | ICD-10-CM

## 2014-03-13 DIAGNOSIS — IMO0001 Reserved for inherently not codable concepts without codable children: Secondary | ICD-10-CM | POA: Diagnosis not present

## 2014-03-13 DIAGNOSIS — M25612 Stiffness of left shoulder, not elsewhere classified: Secondary | ICD-10-CM

## 2014-03-13 NOTE — Progress Notes (Signed)
Occupational Therapy Treatment Patient Details  Name: Nathaniel Frye MRN: [id number] Date of Birth: 1971-06-10  Today's Date: 03/13/2014 Time: 4098-1191 OT Time Calculation (min): 39 min Manual therapy [phone] 22' Therapeutic exercises [phone] 17' Visit#: 2 of 16  Re-eval: 04/08/14    Authorization: [employer]   Subjective S:  I did my exercises.  It doesnt hurt too bad unless I move it. Limitations: Dr. Ave Filter - No elbow flexion against resistance, OK for full elbow ROM, full shoulder AROM/PROM Pain Assessment Currently in Pain?: Yes Pain Score: 3  Pain Location: Shoulder Pain Orientation: Left Pain Type: Acute pain  Precautions/Restrictions     Dr. Ave Filter - No elbow flexion against resistance, OK for full elbow ROM, full shoulder AROM/PROM  Exercise/Treatments Supine Protraction: PROM;10 reps Horizontal ABduction: PROM;10 reps External Rotation: PROM;10 reps Internal Rotation: PROM;10 reps Flexion: PROM;10 reps ABduction: PROM;10 reps Other Supine Exercises: elbow flexion and extension PROM 10 times Seated Elevation: AROM;10 reps Extension: AROM;10 reps (palms forward) Row: AROM;10 reps Pulleys Flexion: 1 minute ABduction: 1 minute Therapy Ball Flexion: 10 reps ABduction: 10 reps ROM / Strengthening / Isometric Strengthening Other ROM/Strengthening Exercises: elbow flexion and extension isometrics 3X3"  Flexion: Supine;3X3" Extension: Supine;3X3" External Rotation: Supine;3X3" Internal Rotation: Supine;3X3" ABduction: Supine;3X3" ADduction: Supine;3X3"    Manual Therapy Manual Therapy: Myofascial release Myofascial Release: Myofascial release and manual stretching to left upper arm, biceps region, scapular and shoulder region to decrease pain and fascial restrictions and increase pain free mobility in left shoulder and arm.   Occupational Therapy Assessment and Plan OT Assessment and Plan Clinical Impression Statement: A:  Began therapeutic exercises this  date.  Patient had 3/10 pain in inferior scapular area at end of session, added trigger point massage and patient had some relief. OT Plan: P:  Increase contraction time with isometric contractions and add AAROM in supine.    Goals Home Exercise Program Pt/caregiver will Perform Home Exercise Program: For increased ROM;For increased strengthening Short Term Goals Time to Complete Short Term Goals: 4 weeks Short Term Goal 1: Pt will be educated on HEP Short Term Goal 1 Progress: Progressing toward goal Short Term Goal 2: Pt will increase LUE PROM to [facility] to improved ability to dress and bathe self Short Term Goal 2 Progress: Progressing toward goal Short Term Goal 3: Pt will decrease fascial restrictions to mininimal overall for decrased pain with movement Short Term Goal 3 Progress: Progressing toward goal Short Term Goal 4: Pt will have pain of less than 4/10 during driving tasks. Short Term Goal 4 Progress: Progressing toward goal Long Term Goals Time to Complete Long Term Goals: 8 weeks Long Term Goal 1: Pt will achieve highest level of functioning in all ADL, IADL, work, and leisure tasks. Long Term Goal 1 Progress: Progressing toward goal Long Term Goal 2: Pt will increase LUE AROM to [facility] for increased independence in dressing and bathing himself. Long Term Goal 2 Progress: Progressing toward goal Long Term Goal 3: Pt will achieve strength in LUE of atleast 4+/5 for abilty to engage in all work related activites. Long Term Goal 3 Progress: Progressing toward goal Long Term Goal 4: Pt will decrease fascial restrictions to trace for decresed pain with movement Long Term Goal 4 Progress: Progressing toward goal Long Term Goal 5: Pt will have pain of less than 2/10 during daily activites. Long Term Goal 5 Progress: Progressing toward goal  Problem List Patient Active Problem List   Diagnosis Date Noted  . Pain in joint, shoulder region  03/11/2014  . Shoulder weakness 03/11/2014  .  Decreased range of motion of left shoulder 03/11/2014    End of Session Activity Tolerance: Patient tolerated treatment well General Behavior During Therapy: [facility] for tasks assessed/performed  GO    Shirlean MylarBethany H. Murray, OTR/L [phone]  03/13/2014, 9:26 AM

## 2014-03-17 ENCOUNTER — Ambulatory Visit (HOSPITAL_COMMUNITY): Payer: BC Managed Care – PPO

## 2014-03-17 ENCOUNTER — Ambulatory Visit (HOSPITAL_COMMUNITY)
Admission: RE | Admit: 2014-03-17 | Discharge: 2014-03-17 | Disposition: A | Discharge status: Home / Self Care | Payer: Worker's Compensation | Source: Ambulatory Visit | Attending: Orthopedic Surgery | Admitting: Orthopedic Surgery

## 2014-03-17 DIAGNOSIS — IMO0001 Reserved for inherently not codable concepts without codable children: Secondary | ICD-10-CM | POA: Diagnosis not present

## 2014-03-17 NOTE — Progress Notes (Signed)
Occupational Therapy Treatment Patient Details  Name: Nathaniel Frye MRN: [id number] Date of Birth: 1971/06/13  Today's Date: 03/17/2014 Time: 1420-1505 OT Time Calculation (min): 45 min Manual 1420-1435 (15') Therapeutic Exercises 1435-1505 (30')  Visit#: 3 of 16  Re-eval: 04/08/14    Authorization: BCBS  Authorization Time Period:    Authorization Visit#:   of    Subjective Symptoms/Limitations Symptoms: "its all good for the moment.  It doesn't hurt right now." Pain Assessment Currently in Pain?: No/denies  Exercise/Treatments Supine Protraction: PROM;AAROM;10 reps Horizontal ABduction: PROM;AAROM;10 reps External Rotation: PROM;AAROM;10 reps Internal Rotation: PROM;AAROM;10 reps Flexion: PROM;AAROM;10 reps ABduction: PROM;AAROM;10 reps Seated Elevation: AROM;10 reps Extension: AROM;10 reps (palms forward) Row: AROM;10 reps Pulleys Flexion: 2 minutes ABduction: 2 minutes ROM / Strengthening / Isometric Strengthening Other ROM/Strengthening Exercises: elbow flexion and extension isometrics 3X5"  Flexion: Supine;3X5" Extension: Supine;3X5" External Rotation: Supine;3X5" Internal Rotation: Supine;3X5" ABduction: Supine;3X5" ADduction: Supine;3X5"    Manual Therapy Manual Therapy: Myofascial release Myofascial Release: Myofascial release and manual stretching to left upper arm, biceps region, scapular and shoulder region to decrease pain and fascial restrictions and increase pain free mobility in left shoulder and arm.    Occupational Therapy Assessment and Plan OT Assessment and Plan Clinical Impression Statement: Increased isometric hold time this session with good tolerance. Added AAROM supine, and pt tolerated well, with slight increase in pain at end range. Increase pulleys to 2 minutes for each flexion and abudction - pt toelrated well and verbalized a good stretch. OT Plan: increase AAROM supine repititions.  Add AAROM seated.   Goals Short Term  Goals Short Term Goal 1: Pt will be educated on HEP Short Term Goal 1 Progress: Progressing toward goal Short Term Goal 2: Pt will increase LUE PROM to [facility] to improved ability to dress and bathe self Short Term Goal 2 Progress: Progressing toward goal Short Term Goal 3: Pt will decrease fascial restrictions to mininimal overall for decrased pain with movement Short Term Goal 3 Progress: Progressing toward goal Short Term Goal 4: Pt will have pain of less than 4/10 during driving tasks. Short Term Goal 4 Progress: Progressing toward goal Long Term Goals Long Term Goal 1: Pt will achieve highest level of functioning in all ADL, IADL, work, and leisure tasks. Long Term Goal 1 Progress: Progressing toward goal Long Term Goal 2: Pt will increase LUE AROM to [facility] for increased independence in dressing and bathing himself. Long Term Goal 2 Progress: Progressing toward goal Long Term Goal 3: Pt will achieve strength in LUE of atleast 4+/5 for abilty to engage in all work related activites. Long Term Goal 3 Progress: Progressing toward goal Long Term Goal 4: Pt will decrease fascial restrictions to trace for decresed pain with movement Long Term Goal 4 Progress: Progressing toward goal Long Term Goal 5: Pt will have pain of less than 2/10 during daily activites. Long Term Goal 5 Progress: Progressing toward goal  Problem List Patient Active Problem List   Diagnosis Date Noted  . Pain in joint, shoulder region 03/11/2014  . Shoulder weakness 03/11/2014  . Decreased range of motion of left shoulder 03/11/2014    End of Session Activity Tolerance: Patient tolerated treatment well General Behavior During Therapy: [facility] for tasks assessed/performed  GO    Marry Guan, MS, OTR/L [phone]  03/17/2014, 3:08 PM

## 2014-03-19 ENCOUNTER — Ambulatory Visit (HOSPITAL_COMMUNITY): Payer: BC Managed Care – PPO

## 2014-03-19 ENCOUNTER — Ambulatory Visit (HOSPITAL_COMMUNITY)
Admission: RE | Admit: 2014-03-19 | Discharge: 2014-03-19 | Disposition: A | Discharge status: Home / Self Care | Payer: Worker's Compensation | Source: Ambulatory Visit | Attending: Orthopedic Surgery | Admitting: Orthopedic Surgery

## 2014-03-19 DIAGNOSIS — IMO0001 Reserved for inherently not codable concepts without codable children: Secondary | ICD-10-CM | POA: Diagnosis not present

## 2014-03-19 NOTE — Progress Notes (Signed)
Occupational Therapy Treatment Patient Details  Name: Nathaniel Frye MRN: [id number] Date of Birth: [birth year]  Today's Date: 03/19/2014 Time: 1520-1602 OT Time Calculation (min): 42 min Manual 1520-1535 (15') Therapeutic Exercises 1535-1602 (27')  Visit#: 4 of 16  Re-eval: 04/08/14    Authorization: BCBS  Authorization Time Period:    Authorization Visit#:   of    Subjective Symptoms/Limitations Symptoms: 'its hurts a little bit today, right on the top there." Pain Assessment Currently in Pain?: Yes Pain Score: 5  Pain Location: Shoulder Pain Orientation: Left Pain Type: Acute pain  Exercise/Treatments Supine Protraction: PROM;10 reps;AAROM;15 reps Horizontal ABduction: PROM;10 reps;AAROM;15 reps External Rotation: PROM;10 reps;AAROM;15 reps Internal Rotation: PROM;10 reps;AAROM;15 reps Flexion: PROM;10 reps;AAROM;15 reps ABduction: PROM;10 reps;AAROM;15 reps Seated Elevation: AROM;15 reps Extension: AROM;15 reps Row: AROM;15 reps Protraction: AAROM;10 reps Horizontal ABduction: AAROM;10 reps External Rotation: AAROM;10 reps Internal Rotation: AAROM;10 reps Flexion: AAROM;10 reps Abduction: AAROM;10 reps     Manual Therapy Manual Therapy: Myofascial release Myofascial Release: Myofascial release and manual stretching to left upper arm, biceps region, scapular and shoulder region to decrease pain and fascial restrictions and increase pain free mobility in left shoulder and arm.    Occupational Therapy Assessment and Plan OT Assessment and Plan Clinical Impression Statement: Increased supine dowel exercises reps, and pt tolerated well,, with slight increased in pain at end range.Added AAROM seated this session. pt tolerated well, but verbalized some fatigue at end of session. Educated pt on AAROM HEP  - pt verbalized understanding. OT Plan: Follow up on AAROM HEP.  Add scapular theraband.   Goals Short Term Goals Short Term Goal 1: Pt will be educated on  HEP Short Term Goal 1 Progress: Progressing toward goal Short Term Goal 2: Pt will increase LUE PROM to [facility] to improved ability to dress and bathe self Short Term Goal 2 Progress: Progressing toward goal Short Term Goal 3: Pt will decrease fascial restrictions to mininimal overall for decrased pain with movement Short Term Goal 3 Progress: Progressing toward goal Short Term Goal 4: Pt will have pain of less than 4/10 during driving tasks. Short Term Goal 4 Progress: Progressing toward goal Long Term Goals Long Term Goal 1: Pt will achieve highest level of functioning in all ADL, IADL, work, and leisure tasks. Long Term Goal 1 Progress: Progressing toward goal Long Term Goal 2: Pt will increase LUE AROM to [facility] for increased independence in dressing and bathing himself. Long Term Goal 2 Progress: Progressing toward goal Long Term Goal 3: Pt will achieve strength in LUE of atleast 4+/5 for abilty to engage in all work related activites. Long Term Goal 3 Progress: Progressing toward goal Long Term Goal 4: Pt will decrease fascial restrictions to trace for decresed pain with movement Long Term Goal 4 Progress: Progressing toward goal Long Term Goal 5: Pt will have pain of less than 2/10 during daily activites. Long Term Goal 5 Progress: Progressing toward goal  Problem List Patient Active Problem List   Diagnosis Date Noted  . Pain in joint, shoulder region 03/11/2014  . Shoulder weakness 03/11/2014  . Decreased range of motion of left shoulder 03/11/2014    End of Session Activity Tolerance: Patient tolerated treatment well General Behavior During Therapy: [facility] for tasks assessed/performed OT Plan of Care OT Home Exercise Plan: towel slides    8/27 Added supine AAROM exercises OT Patient Instructions: explained and demonstrated. Handout provided (scanned). Pt verbalized understanding Consulted and Agree with Plan of Care: Patient  GO   Hilda Lias  Doran Clay, MS, OTR/L [phone]   03/19/2014, 4:12 PM

## 2014-03-24 ENCOUNTER — Ambulatory Visit (HOSPITAL_COMMUNITY)
Admission: RE | Admit: 2014-03-24 | Discharge: 2014-03-24 | Disposition: A | Discharge status: Home / Self Care | Payer: BC Managed Care – PPO | Source: Ambulatory Visit | Attending: Orthopedic Surgery | Admitting: Orthopedic Surgery

## 2014-03-24 DIAGNOSIS — M6281 Muscle weakness (generalized): Secondary | ICD-10-CM | POA: Insufficient documentation

## 2014-03-24 DIAGNOSIS — M25529 Pain in unspecified elbow: Secondary | ICD-10-CM | POA: Diagnosis not present

## 2014-03-24 DIAGNOSIS — M25519 Pain in unspecified shoulder: Secondary | ICD-10-CM | POA: Insufficient documentation

## 2014-03-24 DIAGNOSIS — M25512 Pain in left shoulder: Secondary | ICD-10-CM

## 2014-03-24 DIAGNOSIS — R29898 Other symptoms and signs involving the musculoskeletal system: Secondary | ICD-10-CM

## 2014-03-24 DIAGNOSIS — IMO0001 Reserved for inherently not codable concepts without codable children: Secondary | ICD-10-CM | POA: Diagnosis present

## 2014-03-24 DIAGNOSIS — M25619 Stiffness of unspecified shoulder, not elsewhere classified: Secondary | ICD-10-CM | POA: Insufficient documentation

## 2014-03-24 DIAGNOSIS — M25612 Stiffness of left shoulder, not elsewhere classified: Secondary | ICD-10-CM

## 2014-03-24 NOTE — Progress Notes (Signed)
Occupational Therapy Treatment Patient Details  Name: Nathaniel Frye MRN: [id number] Date of Birth: 06-19-1971  Today's Date: 03/24/2014 Time: 4098-1191 OT Time Calculation (min): 38 min MFR: 4782-9562 13' Therex: 1308-6578 25'  Visit#: 5 of 16  Re-eval: 04/08/14    Authorization: BCBS  Authorization Time Period:    Authorization Visit#:   of    Subjective Symptoms/Limitations Symptoms: S: It is alright, I go to the doctor this week to check it.  Pain Assessment Currently in Pain?: No/denies  Precautions/Restrictions  Precautions Precaution Comments: No elbow flexion against resistance, OK for full elbow ROM, full shoulder AROM/PROM  Exercise/Treatments Supine Protraction: PROM;5 reps;AAROM;15 reps Horizontal ABduction: PROM;5 reps;AAROM;15 reps External Rotation: PROM;5 reps;AAROM;15 reps Internal Rotation: PROM;5 reps;AAROM;15 reps Flexion: PROM;5 reps;AAROM;15 reps ABduction: PROM;5 reps;AAROM;15 reps Seated Extension: Theraband;12 reps (Standing) Theraband Level (Shoulder Extension): Level 2 (Red) Horizontal ABduction: Theraband;12 reps (standing) Theraband Level (Shoulder Horizontal ABduction): Level 2 (Red) Flexion: Theraband;12 reps (standing) Theraband Level (Shoulder Flexion): Level 2 (Red)    Standing Protraction: AAROM;15 reps Horizontal ABduction: AAROM;15 reps External Rotation: AAROM;15 reps Internal Rotation: AAROM;15 reps Flexion: AAROM;15 reps ABduction: AAROM;15 reps    ROM / Strengthening / Isometric Strengthening Wall Wash: 1' Proximal Shoulder Strengthening, Supine: 12X Proximal Shoulder Strengthening, Seated: 12X         Manual Therapy Manual Therapy: Myofascial release Myofascial Release: Myofascial release and manual stretching to left upper arm, biceps region, scapular and shoulder region to decrease pain and fascial restrictions and increase pain free mobility in left shoulder and arm  Occupational Therapy Assessment and Plan OT  Assessment and Plan Clinical Impression Statement: A: Added proximal shoulder strengthening, wall wash, and red theraband exercises-shoulder flexion, extention, and horizontal abduction; row and retraction not completed due to doctors orders of no elbow flexion against resistance. Pt tolerated well, pain noted in LUE towards end of AAROM exercises in standing. Pt reports he is completing his HEP one to two times per day.  OT Plan: P: Attempt AROM in supine and standing.    Goals Short Term Goals Short Term Goal 1: Pt will be educated on HEP Short Term Goal 1 Progress: Progressing toward goal Short Term Goal 2: Pt will increase LUE PROM to [facility] to improved ability to dress and bathe self Short Term Goal 2 Progress: Progressing toward goal Short Term Goal 3: Pt will decrease fascial restrictions to mininimal overall for decrased pain with movement Short Term Goal 3 Progress: Progressing toward goal Short Term Goal 4: Pt will have pain of less than 4/10 during driving tasks. Short Term Goal 4 Progress: Progressing toward goal Long Term Goals Long Term Goal 1: Pt will achieve highest level of functioning in all ADL, IADL, work, and leisure tasks. Long Term Goal 1 Progress: Progressing toward goal Long Term Goal 2: Pt will increase LUE AROM to [facility] for increased independence in dressing and bathing himself. Long Term Goal 2 Progress: Progressing toward goal Long Term Goal 3: Pt will achieve strength in LUE of atleast 4+/5 for abilty to engage in all work related activites. Long Term Goal 3 Progress: Progressing toward goal Long Term Goal 4: Pt will decrease fascial restrictions to trace for decresed pain with movement Long Term Goal 4 Progress: Progressing toward goal Long Term Goal 5: Pt will have pain of less than 2/10 during daily activites. Long Term Goal 5 Progress: Progressing toward goal  Problem List Patient Active Problem List   Diagnosis Date Noted  . Pain in joint, shoulder region  03/11/2014  . Shoulder weakness 03/11/2014  . Decreased range of motion of left shoulder 03/11/2014    End of Session Activity Tolerance: Patient tolerated treatment well General Behavior During Therapy: [facility] for tasks assessed/performed      Limmie Patricia, OTR/L,CBIS   03/24/2014, 3:15 PM

## 2014-03-27 ENCOUNTER — Ambulatory Visit (HOSPITAL_COMMUNITY)
Admission: RE | Admit: 2014-03-27 | Discharge: 2014-03-27 | Disposition: A | Discharge status: Home / Self Care | Payer: BC Managed Care – PPO | Source: Ambulatory Visit | Attending: Orthopedic Surgery | Admitting: Orthopedic Surgery

## 2014-03-27 DIAGNOSIS — M25512 Pain in left shoulder: Secondary | ICD-10-CM

## 2014-03-27 DIAGNOSIS — IMO0001 Reserved for inherently not codable concepts without codable children: Secondary | ICD-10-CM | POA: Diagnosis not present

## 2014-03-27 DIAGNOSIS — R29898 Other symptoms and signs involving the musculoskeletal system: Secondary | ICD-10-CM

## 2014-03-27 DIAGNOSIS — M25612 Stiffness of left shoulder, not elsewhere classified: Secondary | ICD-10-CM

## 2014-03-27 NOTE — Progress Notes (Signed)
Occupational Therapy Treatment Patient Details  Name: Nathaniel Frye MRN: [id number] Date of Birth: 08-Mar-1971  Today's Date: 03/27/2014 Time: 5621-3086 OT Time Calculation (min): 37 min MFR: 5784-6962 9' Therex: 9528-4132 28'   Visit#: 6 of 16  Re-eval: 04/08/14    Authorization: Ezequiel Essex  Authorization Time Period:    Authorization Visit#:   of    Subjective Symptoms/Limitations Symptoms: S: It's sore, right at the shoulder and the bicep.  Pain Assessment Currently in Pain?: Yes Pain Score: 5  Pain Location: Shoulder Pain Orientation: Left Pain Type: Acute pain  Precautions/Restrictions  Precautions Precaution Comments: No elbow flexion against resistance, OK for full elbow ROM, full shoulder AROM/PROM  Exercise/Treatments Supine Protraction: PROM;5 reps;AROM;12 reps Horizontal ABduction: PROM;5 reps;AROM;12 reps External Rotation: PROM;5 reps;AROM;12 reps Internal Rotation: PROM;5 reps;AROM;12 reps Flexion: PROM;5 reps;AROM;12 reps ABduction: PROM;5 reps;AROM;12 reps Seated Extension: Theraband;12 reps Theraband Level (Shoulder Extension): Level 2 (Red) Horizontal ABduction: Theraband;12 reps Theraband Level (Shoulder Horizontal ABduction): Level 2 (Red) Flexion: Theraband;12 reps Theraband Level (Shoulder Flexion): Level 2 (Red)   Standing Protraction: AROM;12 reps Horizontal ABduction: AROM;12 reps External Rotation: AROM;12 reps Internal Rotation: AROM;12 reps Flexion: AROM;12 reps ABduction: AROM;12 reps   ROM / Strengthening / Isometric Strengthening Wall Wash: 1:30 Thumb Tacks: 1' "W" Arms: 12X X to V Arms: 12X Proximal Shoulder Strengthening, Supine: 12X Proximal Shoulder Strengthening, Seated: 12X        Manual Therapy Manual Therapy: Myofascial release Myofascial Release: Myofascial release and manual stretching to left upper arm, biceps region, scapular and shoulder region to decrease pain and fascial restrictions and increase pain free  mobility in left shoulder and arm  Occupational Therapy Assessment and Plan OT Assessment and Plan Clinical Impression Statement: A: Added AROM exercises supine and sitting. Added x to v arms, w arms, and thumb tacks.  Increased wall wash to 1:30'.  Patient tolerated well.  Patient reported pain/soreness in shoulder and bicep region at beginning of session.   OT Plan: P: Increase AROM exercises to 15 repetitions in supine and standing.    Goals Short Term Goals Short Term Goal 1: Pt will be educated on HEP Short Term Goal 2: Pt will increase LUE PROM to [facility] to improved ability to dress and bathe self Short Term Goal 3: Pt will decrease fascial restrictions to mininimal overall for decrased pain with movement Short Term Goal 4: Pt will have pain of less than 4/10 during driving tasks. Long Term Goals Long Term Goal 1: Pt will achieve highest level of functioning in all ADL, IADL, work, and leisure tasks. Long Term Goal 2: Pt will increase LUE AROM to [facility] for increased independence in dressing and bathing himself. Long Term Goal 3: Pt will achieve strength in LUE of atleast 4+/5 for abilty to engage in all work related activites. Long Term Goal 4: Pt will decrease fascial restrictions to trace for decresed pain with movement Long Term Goal 5: Pt will have pain of less than 2/10 during daily activites.  Problem List Patient Active Problem List   Diagnosis Date Noted  . Pain in joint, shoulder region 03/11/2014  . Shoulder weakness 03/11/2014  . Decreased range of motion of left shoulder 03/11/2014    End of Session Activity Tolerance: Patient tolerated treatment well General Behavior During Therapy: [facility] for tasks assessed/performed      Limmie Patricia, OTR/L,CBIS   03/27/2014, 3:18 PM

## 2014-04-01 ENCOUNTER — Ambulatory Visit (HOSPITAL_COMMUNITY)
Admission: RE | Admit: 2014-04-01 | Discharge: 2014-04-01 | Disposition: A | Discharge status: Home / Self Care | Payer: BC Managed Care – PPO | Source: Ambulatory Visit | Attending: Orthopedic Surgery | Admitting: Orthopedic Surgery

## 2014-04-01 DIAGNOSIS — M25512 Pain in left shoulder: Secondary | ICD-10-CM

## 2014-04-01 DIAGNOSIS — IMO0001 Reserved for inherently not codable concepts without codable children: Secondary | ICD-10-CM | POA: Diagnosis not present

## 2014-04-01 DIAGNOSIS — M25612 Stiffness of left shoulder, not elsewhere classified: Secondary | ICD-10-CM

## 2014-04-01 DIAGNOSIS — R29898 Other symptoms and signs involving the musculoskeletal system: Secondary | ICD-10-CM

## 2014-04-01 NOTE — Progress Notes (Signed)
Occupational Therapy Treatment Patient Details  Name: Nathaniel Frye MRN: [id number] Date of Birth: 1970/10/26  Today's Date: 04/01/2014 Time: 3762-8315 OT Time Calculation (min): 37 min Manual therapy 850-905 15' Therapeutic exercises 905-927 22'  Visit#: 7 of 16  Re-eval: 04/08/14    Authorization: BCBS  Subjective  S:  I started having some pain in my shoulder and arm this weekend.   Pain Assessment Currently in Pain?: Yes Pain Score: 5  Pain Location: Shoulder Pain Orientation: Left Pain Type: Acute pain  Precautions/Restrictions   no strengthening for 6 weeks post op  Exercise/Treatments Supine Protraction: PROM;5 reps;AROM;15 reps Horizontal ABduction: PROM;5 reps;AROM;15 reps External Rotation: PROM;5 reps;AROM;15 reps Internal Rotation: PROM;5 reps;AROM;15 reps Flexion: PROM;5 reps;AROM;15 reps ABduction: PROM;5 reps;AROM;15 reps Other Supine Exercises: elbow flexion and extension 5 PROM AROM 15 times  Standing Protraction: AROM;15 reps Horizontal ABduction: AROM;15 reps External Rotation: AROM;15 reps Internal Rotation: AROM;15 reps Flexion: AROM;15 reps ABduction: AROM;15 reps ROM / Strengthening / Isometric Strengthening Wall Wash: 2 minutes "W" Arms: 15X X to V Arms: 15X Proximal Shoulder Strengthening, Seated: 15X Prot/Ret//Elev/Dep: 1' with vg to focus on retraction      Manual Therapy Manual Therapy: Myofascial release Myofascial Release: Myofascial release and manual stretching to left upper arm, biceps region, scapular and shoulder region to decrease pain and fascial restrictions and increase pain free mobility in left shoulder and arm  Occupational Therapy Assessment and Plan OT Assessment and Plan Clinical Impression Statement: A:  focused on scapular strengthening to improve shoulder alignment.  WFL AROM this date.  OT Plan: P:  Attempt prone AROM.     Goals Short Term Goals Short Term Goal 1: Pt will be educated on HEP Short Term Goal 2:  Pt will increase LUE PROM to [facility] to improved ability to dress and bathe self Short Term Goal 3: Pt will decrease fascial restrictions to mininimal overall for decrased pain with movement Short Term Goal 4: Pt will have pain of less than 4/10 during driving tasks. Long Term Goals Long Term Goal 1: Pt will achieve highest level of functioning in all ADL, IADL, work, and leisure tasks. Long Term Goal 2: Pt will increase LUE AROM to [facility] for increased independence in dressing and bathing himself. Long Term Goal 3: Pt will achieve strength in LUE of atleast 4+/5 for abilty to engage in all work related activites. Long Term Goal 4: Pt will decrease fascial restrictions to trace for decresed pain with movement Long Term Goal 5: Pt will have pain of less than 2/10 during daily activites.  Problem List Patient Active Problem List   Diagnosis Date Noted  . Pain in joint, shoulder region 03/11/2014  . Shoulder weakness 03/11/2014  . Decreased range of motion of left shoulder 03/11/2014    End of Session Activity Tolerance: Patient tolerated treatment well General Behavior During Therapy: [facility] for tasks assessed/performed OT Plan of Care OT Home Exercise Plan: 04/01/14 added scapular retraction   [place], OTR/L [phone]  04/01/2014, 9:33 AM

## 2014-04-03 ENCOUNTER — Ambulatory Visit (HOSPITAL_COMMUNITY)
Admission: RE | Admit: 2014-04-03 | Discharge: 2014-04-03 | Disposition: A | Discharge status: Home / Self Care | Payer: BC Managed Care – PPO | Source: Ambulatory Visit | Attending: Orthopedic Surgery | Admitting: Orthopedic Surgery

## 2014-04-03 DIAGNOSIS — M25512 Pain in left shoulder: Secondary | ICD-10-CM

## 2014-04-03 DIAGNOSIS — R29898 Other symptoms and signs involving the musculoskeletal system: Secondary | ICD-10-CM

## 2014-04-03 DIAGNOSIS — M25612 Stiffness of left shoulder, not elsewhere classified: Secondary | ICD-10-CM

## 2014-04-03 DIAGNOSIS — IMO0001 Reserved for inherently not codable concepts without codable children: Secondary | ICD-10-CM | POA: Diagnosis not present

## 2014-04-03 NOTE — Progress Notes (Signed)
Occupational Therapy Treatment Patient Details  Name: Nathaniel Frye MRN: [id number] Date of Birth: [birth year]  Today's Date: 04/03/2014 Time: 8295-6213 OT Time Calculation (min): 43 min Manual therapy 852-916 24' Therapeutic exercises 916-935 19'  Visit#: 8 of 16  Re-eval: 04/08/14    Authorization: [employer]BS  Authorization Time Period:    Authorization Visit#:   of    Subjective  S:  I have some swelling in my armpit region. (OTR/L assessed axillary region and patient has swollen region that is sore to palpation.  I recommended he contact his surgeon and or his PCP to have this assessed.) Pertinent History: Pt is 43 yo male presenting to outpatient OT s/p left bicep tenodesis.  Pt fell at work in February on some ice, wrenching his shoulder.  he waited for 3 months to see if it would heal on its own, but the pain only increased, also causing difficulty sleeping.  He had surgery on 03/02/14. Pain Assessment Currently in Pain?: Yes Pain Score: 6  Pain Location: Shoulder Pain Orientation: Left  Precautions/Restrictions   p  Exercise/Treatments Supine Protraction: PROM;5 reps Horizontal ABduction: PROM;5 reps External Rotation: PROM;5 reps Internal Rotation: PROM;5 reps Flexion: PROM;5 reps ABduction: PROM;5 reps Prone  Retraction: AROM;10 reps Flexion: AROM;10 reps Extension: AROM;10 reps External Rotation: AROM;10 reps Internal Rotation: AROM;10 reps Standing Extension: Theraband;15 reps Theraband Level (Shoulder Extension): Level 3 (Green) Row: Theraband;15 reps Theraband Level (Shoulder Row): Level 3 (Green) Retraction: Theraband;15 reps Theraband Level (Shoulder Retraction): Level 3 (Green) ROM / Strengthening / Isometric Strengthening Wall Wash: 2 minutes "W" Arms: 15X X to V Arms: 15X Proximal Shoulder Strengthening, Seated: 15X      Manual Therapy Manual Therapy: Myofascial release Myofascial Release: Myofascial release and manual stretching to left upper arm,  biceps region, scapular and shoulder region to decrease pain and fascial restrictions and increase pain free mobility in left shoulder and arm  Occupational Therapy Assessment and Plan OT Assessment and Plan Clinical Impression Statement: A:  removed dried skin from around surgical scar with forceps.  Patient has tender swollen area in left axillary area this date.  Added prone AROM exercises this date with min difficulty. OT Plan: P:  Increase prone AROM repetitions, add overhead lace, increase to 3 minutes with wall wash and add isometric elbow flexion extension strengthening.    Goals Short Term Goals Short Term Goal 1: Pt will be educated on HEP Short Term Goal 1 Progress: Progressing toward goal Short Term Goal 2: Pt will increase LUE PROM to [facility] to improved ability to dress and bathe self Short Term Goal 2 Progress: Progressing toward goal Short Term Goal 3: Pt will decrease fascial restrictions to mininimal overall for decrased pain with movement Short Term Goal 3 Progress: Progressing toward goal Short Term Goal 4: Pt will have pain of less than 4/10 during driving tasks. Short Term Goal 4 Progress: Progressing toward goal Long Term Goals Long Term Goal 1: Pt will achieve highest level of functioning in all ADL, IADL, work, and leisure tasks. Long Term Goal 1 Progress: Progressing toward goal Long Term Goal 2: Pt will increase LUE AROM to [facility] for increased independence in dressing and bathing himself. Long Term Goal 2 Progress: Progressing toward goal Long Term Goal 3: Pt will achieve strength in LUE of atleast 4+/5 for abilty to engage in all work related activites. Long Term Goal 3 Progress: Progressing toward goal Long Term Goal 4: Pt will decrease fascial restrictions to trace for decresed pain with movement Long  Term Goal 4 Progress: Progressing toward goal Long Term Goal 5: Pt will have pain of less than 2/10 during daily activites. Long Term Goal 5 Progress: Progressing  toward goal  Problem List Patient Active Problem List   Diagnosis Date Noted  . Pain in joint, shoulder region 03/11/2014  . Shoulder weakness 03/11/2014  . Decreased range of motion of left shoulder 03/11/2014    End of Session Activity Tolerance: Patient tolerated treatment well General Behavior During Therapy: [facility] for tasks assessed/performed  GO    Shirlean Mylar, OTR/L [phone]  04/03/2014, 10:14 AM

## 2014-04-07 ENCOUNTER — Ambulatory Visit (HOSPITAL_COMMUNITY)
Admission: RE | Admit: 2014-04-07 | Discharge: 2014-04-07 | Disposition: A | Discharge status: Home / Self Care | Payer: BC Managed Care – PPO | Source: Ambulatory Visit | Attending: Orthopedic Surgery | Admitting: Orthopedic Surgery

## 2014-04-07 DIAGNOSIS — IMO0001 Reserved for inherently not codable concepts without codable children: Secondary | ICD-10-CM | POA: Diagnosis not present

## 2014-04-07 NOTE — Progress Notes (Signed)
Occupational Therapy Treatment Patient Details  Name: Nathaniel Frye MRN: [id number] Date of Birth: [birth year]  Today's Date: 04/07/2014 Time: 4098-1191 OT Time Calculation (min): 42 min Manual 1348-1402 (14') Therapeutic Exercises 1402-1430 (28')  Visit#: 9 of 16  Re-eval: 04/08/14    Authorization: BCBS  Authorization Time Period:    Authorization Visit#:   of    Subjective Symptoms/Limitations Symptoms: "its better than it was." Limitations: Dr. Ave Filter - No elbow flexion against resistance, OK for full elbow ROM, full shoulder AROM/PROM Pain Assessment Currently in Pain?: Yes Pain Score: 3  Pain Location: Shoulder Pain Orientation: Left Pain Type: Acute pain  Precautions/Restrictions     Exercise/Treatments Supine Protraction: PROM;5 reps Horizontal ABduction: PROM;5 reps External Rotation: PROM;5 reps Internal Rotation: PROM;5 reps Flexion: PROM;5 reps ABduction: PROM;5 reps Prone  Other Prone Exercises: Hugh[place] exercises 1-5, 12 reps each AROM Standing Protraction: AROM;15 reps Horizontal ABduction: AROM;15 reps External Rotation: AROM;15 reps Internal Rotation: AROM;15 reps Flexion: AROM;15 reps ABduction: AROM;15 reps ROM / Strengthening / Isometric Strengthening Over Head Lace: 2 min, in standing   Manual Therapy Manual Therapy: Myofascial release Myofascial Release: Myofascial release and manual stretching to left upper arm, biceps region, scapular and shoulder region to decrease pain and fascial restrictions and increase pain free mobility in left shoulder and arm.    Occupational Therapy Assessment and Plan OT Assessment and Plan Clinical Impression Statement: Pt reported overall improvements in pain.  continued prone exercises with addition of Hughston this session. pt tolerted well, but with some pain.  Pt had increased pain over bicep and anterior shoulder this session, but had decreased pain in axillary region.  Added standing lacing this  session - pt tolerated well. OT Plan: Reassessment for MD visit.   Goals Short Term Goals Short Term Goal 1: Pt will be educated on HEP Short Term Goal 1 Progress: Progressing toward goal Short Term Goal 2: Pt will increase LUE PROM to [facility] to improved ability to dress and bathe self Short Term Goal 2 Progress: Progressing toward goal Short Term Goal 3: Pt will decrease fascial restrictions to mininimal overall for decrased pain with movement Short Term Goal 3 Progress: Progressing toward goal Short Term Goal 4: Pt will have pain of less than 4/10 during driving tasks. Short Term Goal 4 Progress: Progressing toward goal Long Term Goals Long Term Goal 1: Pt will achieve highest level of functioning in all ADL, IADL, work, and leisure tasks. Long Term Goal 1 Progress: Progressing toward goal Long Term Goal 2: Pt will increase LUE AROM to [facility] for increased independence in dressing and bathing himself. Long Term Goal 2 Progress: Progressing toward goal Long Term Goal 3: Pt will achieve strength in LUE of atleast 4+/5 for abilty to engage in all work related activites. Long Term Goal 3 Progress: Progressing toward goal Long Term Goal 4: Pt will decrease fascial restrictions to trace for decresed pain with movement Long Term Goal 4 Progress: Progressing toward goal Long Term Goal 5: Pt will have pain of less than 2/10 during daily activites. Long Term Goal 5 Progress: Progressing toward goal  Problem List Patient Active Problem List   Diagnosis Date Noted  . Pain in joint, shoulder region 03/11/2014  . Shoulder weakness 03/11/2014  . Decreased range of motion of left shoulder 03/11/2014    End of Session Activity Tolerance: Patient tolerated treatment well General Behavior During Therapy: [facility] for tasks assessed/performed  GO    Marry Guan, MS, OTR/L [phone]  04/07/2014, 4:59  PM

## 2014-04-09 ENCOUNTER — Ambulatory Visit (HOSPITAL_COMMUNITY)
Admission: RE | Admit: 2014-04-09 | Discharge: 2014-04-09 | Disposition: A | Discharge status: Home / Self Care | Payer: BC Managed Care – PPO | Source: Ambulatory Visit | Attending: Orthopedic Surgery | Admitting: Orthopedic Surgery

## 2014-04-09 DIAGNOSIS — R29898 Other symptoms and signs involving the musculoskeletal system: Secondary | ICD-10-CM

## 2014-04-09 DIAGNOSIS — IMO0001 Reserved for inherently not codable concepts without codable children: Secondary | ICD-10-CM | POA: Diagnosis not present

## 2014-04-09 DIAGNOSIS — M25512 Pain in left shoulder: Secondary | ICD-10-CM

## 2014-04-09 DIAGNOSIS — M25612 Stiffness of left shoulder, not elsewhere classified: Secondary | ICD-10-CM

## 2014-04-09 NOTE — Evaluation (Signed)
Occupational Therapy Reassessment  Patient Details  Name: Nathaniel Frye MRN: [id number] Date of Birth: Mar 09, 1971  Today's Date: 04/09/2014 Time: 9769-3008 OT Time Calculation (min): 48 min Reassess: 8901-3855 11' MFR: 2894-2406 22' Reassess: 0920-0935 15'  Visit#: 10 of 24  Re-eval: 05/07/14  Assessment Diagnosis: s/p L bicep tenodesis  Authorization: BCBS  Authorization Time Period:    Authorization Visit#:   of     Past Medical History:  Past Medical History  Diagnosis Date  . Headache(784.0)   . Anxiety   . Medical history non-contributory    Past Surgical History:  Past Surgical History  Procedure Laterality Date  . No past surgeries    . Labral repair Left 03/02/2014    Procedure: Arthroscopy Left Shoulder with Open biceps tenodesis;  Surgeon: Mable Paris, MD;  Location: [facility];  Service: Orthopedics;  Laterality: Left;  Left shoulder arthroscopy debridment labral tear, biceps tenotomy, open biceps tenodesis    Subjective Symptoms/Limitations Symptoms: S: It's feeling better.  Pain Assessment Currently in Pain?: Yes Pain Score: 3  Pain Location: Shoulder Pain Orientation: Left Pain Type: Acute pain  Precautions/Restrictions  Precautions Precaution Comments: Week 4 (04/09/14) AROM, no lifting, posterior capsular stretching, restore full AROM of shoulder/elbow; Week 6-8 (04/23/2014-05/07/2014) use low resistance/weight with high repetition, Initiate bicep curls with light resistance progress as tolerated, resistive supination/pronation, ER/IR with theraband, push up plus, cross body diagonals with theraband, Week 10 (05/21/2014) Advance strengthening phase   Assessment Additional Assessments LUE AROM (degrees) LUE Overall AROM Comments: Pt assessed supine and seated. IR/ER adducted Left Shoulder Flexion:  (173/ 180(on eval: 106 supine)) Left Shoulder ABduction:  (170/ 179(on eval: 100 supine)) Left Shoulder Internal Rotation:  (  90/ 90(on eval Same supine)) Left Shoulder External Rotation:  (90/ 85(on eval: 36 supine)) Left Elbow Flexion:  (43/42 (on eval: 46 supine)) Left Elbow Extension:  (-4/ -4 (on eval: -4)) LUE Strength Left Shoulder Flexion: 3/5 Left Shoulder ABduction: 3/5 Left Shoulder Internal Rotation: 3/5 Left Shoulder External Rotation: 3/5 Left Elbow Flexion: 3/5 Left Elbow Extension: 3/5 Palpation Palpation: Min fascial restrictions in L upper arm and bicep regions.  Moderate fascial restrictions in mid trap region     Exercise/Treatments Supine Protraction: PROM;5 reps Horizontal ABduction: PROM;5 reps External Rotation: PROM;5 reps Internal Rotation: PROM;5 reps Flexion: PROM;5 reps ABduction: PROM;5 reps     Manual Therapy Manual Therapy: Myofascial release Myofascial Release: Myofascial release and manual stretching to left upper arm, biceps region, scapular and shoulder region to decrease pain and fascial restrictions and increase pain free mobility in left shoulder and arm.  Occupational Therapy Assessment and Plan OT Assessment and Plan Clinical Impression Statement: A: Patient reassesed this session. Patient has met all short-term goals and 1/5 long term goals.  Patient reported muscle tightness along scapula, muscle knot/tightness found along medial border near inferior angle of scapula. Therapist reviewed post-operative note from Dr. Ave Filter, stating to follow standard protocol for biceps tenodesis.  Located standard protocol online (see scanned protocol), stating: Week 4 (04/09/14) AROM, no lifting, posterior capsular stretching, restore full AROM of shoulder/elbow; Week 6-8 (04/23/2014-05/07/2014) use low resistance/weight with high repetition, Initiate bicep curls with light resistance progress as tolerated, resistive supination/pronation, ER/IR with theraband, push up plus, cross body diagonals with theraband, Week 10 (05/21/2014) Advance strengthening phase  OT Plan: P: continue  with protocol-AROM exercises (cross body adduction stretch, sleeper stretch)   Goals Short Term Goals Time to Complete Short Term Goals: 4 weeks Short Term Goal 1:  Pt will be educated on HEP Short Term Goal 1 Progress: Met Short Term Goal 2: Pt will increase LUE PROM to [facility] to improved ability to dress and bathe self Short Term Goal 2 Progress: Met Short Term Goal 3: Pt will decrease fascial restrictions to mininimal overall for decrased pain with movement Short Term Goal 3 Progress: Met Short Term Goal 4: Pt will have pain of less than 4/10 during driving tasks. Short Term Goal 4 Progress: Met Long Term Goals Time to Complete Long Term Goals: 8 weeks Long Term Goal 1: Pt will achieve highest level of functioning in all ADL, IADL, work, and leisure tasks. Long Term Goal 1 Progress: Progressing toward goal Long Term Goal 2: Pt will increase LUE AROM to [facility] for increased independence in dressing and bathing himself. Long Term Goal 2 Progress: Met Long Term Goal 3: Pt will achieve strength in LUE of atleast 4+/5 for abilty to engage in all work related activites. Long Term Goal 3 Progress: Progressing toward goal Long Term Goal 4: Pt will decrease fascial restrictions to trace for decresed pain with movement Long Term Goal 4 Progress: Progressing toward goal Long Term Goal 5: Pt will have pain of less than 2/10 during daily activites. Long Term Goal 5 Progress: Progressing toward goal  Problem List Patient Active Problem List   Diagnosis Date Noted  . Pain in joint, shoulder region 03/11/2014  . Shoulder weakness 03/11/2014  . Decreased range of motion of left shoulder 03/11/2014    End of Session Activity Tolerance: Patient tolerated treatment well General Behavior During Therapy: [facility] for tasks assessed/performed   Ailene Ravel, OTR/L,CBIS   04/09/2014, 10:49 AM  Physician Documentation Your signature is required to indicate approval of the treatment plan as stated  above.  Please sign and either send electronically or make a copy of this report for your files and return this physician signed original.  Please mark one 1.__approve of plan  2. ___approve of plan with the following conditions.   ______________________________                                                          _____________________ Physician Signature                                                                                                             Date

## 2014-04-14 ENCOUNTER — Ambulatory Visit (HOSPITAL_COMMUNITY)
Admission: RE | Admit: 2014-04-14 | Discharge: 2014-04-14 | Disposition: A | Discharge status: Home / Self Care | Payer: BC Managed Care – PPO | Source: Ambulatory Visit | Attending: Orthopedic Surgery | Admitting: Orthopedic Surgery

## 2014-04-14 DIAGNOSIS — IMO0001 Reserved for inherently not codable concepts without codable children: Secondary | ICD-10-CM | POA: Diagnosis not present

## 2014-04-14 NOTE — Progress Notes (Signed)
Occupational Therapy Treatment Patient Details  Name: Nathaniel Frye MRN: [id number] Date of Birth: 1971/03/30  Today's Date: 04/14/2014 Time: 3704-8889 OT Time Calculation (min): 39 min Manual 1694-5038 (15') Therapeutic Exercises [phone] (23')  Visit#: 11 of 24  Re-eval: 05/07/14    Authorization: BCBS  Authorization Time Period:    Authorization Visit#:   of    Subjective Symptoms/Limitations Symptoms: "I asked the doctor about the pain under my arm and on my chest.  he says its because the muscles are tight, and that you could work on them." Limitations: On eval: Dr. Tamera Punt - No elbow flexion against resistance, OK for full elbow ROM, full shoulder AROM/PROM. Week 4 (04/09/14) AROM, no lifting, posterior capsular stretching, restore full AROM of shoulder/elbow; Week 6-8 (04/23/2014-05/07/2014) use low resistance/weight with high repetition, Initiate bicep curls with light resistance progress as tolerated, resistive supination/pronation, ER/IR with theraband, push up plus, cross body diagonals with theraband, Week 10 (05/21/2014) Advance strengthening phase.          -------- 9/22 MD stated no lifting above 10 lbs Pain Assessment Currently in Pain?: No/denies  Exercise/Treatments Supine Protraction: PROM;5 reps;Strengthening;10 reps;Weights Protraction Weight (lbs): 1 Horizontal ABduction: PROM;5 reps;Strengthening;10 reps;Weights Horizontal ABduction Weight (lbs): 1 External Rotation: PROM;5 reps;Strengthening;Weights;10 reps External Rotation Weight (lbs): 1 Internal Rotation: PROM;5 reps;Strengthening;Weights;10 reps Internal Rotation Weight (lbs): 1 Flexion: PROM;5 reps;Strengthening;Weights;10 reps Shoulder Flexion Weight (lbs): 1 ABduction: PROM;5 reps;Strengthening;Weights;10 reps Shoulder ABduction Weight (lbs): 1 ROM / Strengthening / Isometric Strengthening Proximal Shoulder Strengthening, Supine: 12x with 1# weight   Stretches Cross Chest Stretch: 3 reps;10  seconds Internal Rotation Stretch: 3 reps (10 second holds. sleeper stretch)    Manual Therapy Manual Therapy: Myofascial release Myofascial Release: Myofascial release and manual stretching to left upper arm, biceps region, scapular and shoulder region to decrease pain and fascial restrictions and increase pain free mobility in left shoulder and arm.    Added MFR to anterior shoulder.upper chest region and axillary/lateral chest region in response to pt's comments of pain and tightness.  Occupational Therapy Assessment and Plan OT Assessment and Plan Clinical Impression Statement: Pt brought new MD order for continuation of OT as needed for strengthening of LUE.  Pt reports that he is cleared to lift up to 10 lbs and will be returning to work tomorrow.  Added 1# weight to supine exercises in respone to MD's order for strengthening.  pt tolerated well with no complaints of pain or diffiuclty.  Educated pt on need to begin strengtheningn slowly, so as not to cause further damage.  Pt verbalized understanding. Added sleeper stretch and cross body stretch. OT Plan: Continue with strengthening supine and standing. Progress slowly as tolerated.   Goals Short Term Goals Short Term Goal 1: Pt will be educated on HEP Short Term Goal 1 Progress: Met Short Term Goal 2: Pt will increase LUE PROM to [facility] to improved ability to dress and bathe self Short Term Goal 2 Progress: Met Short Term Goal 3: Pt will decrease fascial restrictions to mininimal overall for decrased pain with movement Short Term Goal 3 Progress: Met Short Term Goal 4: Pt will have pain of less than 4/10 during driving tasks. Short Term Goal 4 Progress: Met Long Term Goals Long Term Goal 1: Pt will achieve highest level of functioning in all ADL, IADL, work, and leisure tasks. Long Term Goal 1 Progress: Progressing toward goal Long Term Goal 2: Pt will increase LUE AROM to [facility] for increased independence in dressing and bathing  himself.  Long Term Goal 2 Progress: Met Long Term Goal 3: Pt will achieve strength in LUE of atleast 4+/5 for abilty to engage in all work related activites. Long Term Goal 3 Progress: Progressing toward goal Long Term Goal 4: Pt will decrease fascial restrictions to trace for decresed pain with movement Long Term Goal 4 Progress: Progressing toward goal Long Term Goal 5: Pt will have pain of less than 2/10 during daily activites. Long Term Goal 5 Progress: Progressing toward goal  Problem List Patient Active Problem List   Diagnosis Date Noted  . Pain in joint, shoulder region 03/11/2014  . Shoulder weakness 03/11/2014  . Decreased range of motion of left shoulder 03/11/2014    End of Session Activity Tolerance: Patient tolerated treatment well General Behavior During Therapy: [facility] for tasks assessed/performed  GO    Bea Graff, [place], OTR/L [phone]  04/14/2014, 5:01 PM

## 2014-04-16 ENCOUNTER — Telehealth (HOSPITAL_COMMUNITY): Payer: Self-pay

## 2014-04-16 NOTE — Telephone Encounter (Signed)
HE HAS ANOTHER MD APPTMENT AT THIS TIME

## 2014-04-17 ENCOUNTER — Ambulatory Visit (HOSPITAL_COMMUNITY): Payer: BC Managed Care – PPO

## 2014-04-20 ENCOUNTER — Ambulatory Visit (HOSPITAL_COMMUNITY)
Admission: RE | Admit: 2014-04-20 | Discharge: 2014-04-20 | Disposition: A | Discharge status: Home / Self Care | Payer: BC Managed Care – PPO | Source: Ambulatory Visit | Attending: Orthopedic Surgery | Admitting: Orthopedic Surgery

## 2014-04-20 DIAGNOSIS — M25612 Stiffness of left shoulder, not elsewhere classified: Secondary | ICD-10-CM

## 2014-04-20 DIAGNOSIS — IMO0001 Reserved for inherently not codable concepts without codable children: Secondary | ICD-10-CM | POA: Diagnosis not present

## 2014-04-20 DIAGNOSIS — M25512 Pain in left shoulder: Secondary | ICD-10-CM

## 2014-04-20 DIAGNOSIS — R29898 Other symptoms and signs involving the musculoskeletal system: Secondary | ICD-10-CM

## 2014-04-20 NOTE — Progress Notes (Signed)
Note reviewed by clinical instructor and accurately reflects treatment session.  Elky Funches, OTR/L,CBIS   

## 2014-04-20 NOTE — Progress Notes (Signed)
Occupational Therapy Treatment Patient Details  Name: Nathaniel Frye MRN: [id number] Date of Birth: Feb 09, 1971  Today's Date: 04/20/2014 Time: 4098-1191 OT Time Calculation (min): 42 min MFR: 4782-9562 19' Therex: 1308-6578 23'  Visit#: 12 of 24  Re-eval: 05/07/14    Authorization: [employer]BS  Authorization Time Period:    Authorization Visit#:   of    Subjective Symptoms/Limitations Symptoms: S: It's feeling better, it's still sore on my back near my shoulder.  Pain Assessment Currently in Pain?: No/denies  Precautions/Restrictions  Precautions Precaution Comments: Week 4 (04/09/14) AROM, no lifting, posterior capsular stretching, restore full AROM of shoulder/elbow; Week 6-8 (04/23/2014-05/07/2014) use low resistance/weight with high repetition, Initiate bicep curls with light resistance progress as tolerated, resistive supination/pronation, ER/IR with theraband, push up plus, cross body diagonals with theraband, Week 10 (05/21/2014) Advance strengthening phase  Exercise/Treatments Supine Protraction: PROM;5 reps;Strengthening;10 reps;Weights Protraction Weight (lbs): 1 Horizontal ABduction: PROM;5 reps;Strengthening;10 reps;Weights Horizontal ABduction Weight (lbs): 1 External Rotation: PROM;5 reps;Strengthening;Weights;10 reps External Rotation Weight (lbs): 1 Internal Rotation: PROM;5 reps;Strengthening;Weights;10 reps Internal Rotation Weight (lbs): 1 Flexion: PROM;5 reps;Strengthening;Weights;10 reps Shoulder Flexion Weight (lbs): 1 ABduction: PROM;5 reps;Strengthening;Weights;10 reps Shoulder ABduction Weight (lbs): 1   Standing Protraction: Strengthening;Weights;10 reps Protraction Weight (lbs): 1 Horizontal ABduction: Strengthening;Weights;10 reps Horizontal ABduction Weight (lbs): 1 External Rotation: Strengthening;Weights;10 reps External Rotation Weight (lbs): 1 Internal Rotation: Strengthening;Weights;10 reps Internal Rotation Weight (lbs): 1 Flexion:  Strengthening;Weights;10 reps Shoulder Flexion Weight (lbs): 1 ABduction: Strengthening;Weights;10 reps Shoulder ABduction Weight (lbs): 1   ROM / Strengthening / Isometric Strengthening UBE (Upper Arm Bike): Level 1, 2' forward 2' reverse X to V Arms: 12X Proximal Shoulder Strengthening, Supine: 12x with 1# weight Proximal Shoulder Strengthening, Seated: 12X with 1# weight   Stretches Cross Chest Stretch: 3 reps;10 seconds       Manual Therapy Manual Therapy: Myofascial release Myofascial Release: Myofascial release and manual stretching to left upper arm, biceps region, scapular and shoulder region to decrease pain and fascial restrictions and increase pain free mobility in left shoulder and arm. Continued MFR to anterior shoulder.upper chest region and axillary/lateral chest region.  Occupational Therapy Assessment and Plan OT Assessment and Plan Clinical Impression Statement: A: Added strengthening exercises in standing. Added UBE, level 1, 2' forward 2' reverse. Patient tolerated well.  OT Plan: P: Increase strengthening to 12 reps in supine.    Goals Short Term Goals Short Term Goal 1: Pt will be educated on HEP Short Term Goal 2: Pt will increase LUE PROM to [facility] to improved ability to dress and bathe self Short Term Goal 3: Pt will decrease fascial restrictions to mininimal overall for decrased pain with movement Short Term Goal 4: Pt will have pain of less than 4/10 during driving tasks. Long Term Goals Long Term Goal 1: Pt will achieve highest level of functioning in all ADL, IADL, work, and leisure tasks. Long Term Goal 1 Progress: Progressing toward goal Long Term Goal 2: Pt will increase LUE AROM to [facility] for increased independence in dressing and bathing himself. Long Term Goal 3: Pt will achieve strength in LUE of atleast 4+/5 for abilty to engage in all work related activites. Long Term Goal 3 Progress: Progressing toward goal Long Term Goal 4: Pt will decrease  fascial restrictions to trace for decresed pain with movement Long Term Goal 4 Progress: Progressing toward goal Long Term Goal 5: Pt will have pain of less than 2/10 during daily activites. Long Term Goal 5 Progress: Progressing toward goal  Problem List Patient Active Problem  List   Diagnosis Date Noted  . Pain in joint, shoulder region 03/11/2014  . Shoulder weakness 03/11/2014  . Decreased range of motion of left shoulder 03/11/2014    End of Session Activity Tolerance: Patient tolerated treatment well General Behavior During Therapy: [facility] for tasks assessed/performed      Ezra Sites. OT Student  04/20/2014, 3:17 PM

## 2014-04-21 ENCOUNTER — Ambulatory Visit (HOSPITAL_COMMUNITY): Payer: BC Managed Care – PPO

## 2014-04-23 ENCOUNTER — Ambulatory Visit (HOSPITAL_COMMUNITY)
Admission: RE | Admit: 2014-04-23 | Discharge: 2014-04-23 | Disposition: A | Discharge status: Home / Self Care | Payer: Worker's Compensation | Source: Ambulatory Visit | Attending: Orthopedic Surgery | Admitting: Orthopedic Surgery

## 2014-04-23 DIAGNOSIS — M25522 Pain in left elbow: Secondary | ICD-10-CM | POA: Insufficient documentation

## 2014-04-23 DIAGNOSIS — M25512 Pain in left shoulder: Secondary | ICD-10-CM | POA: Insufficient documentation

## 2014-04-23 DIAGNOSIS — M25612 Stiffness of left shoulder, not elsewhere classified: Secondary | ICD-10-CM | POA: Insufficient documentation

## 2014-04-23 DIAGNOSIS — Z5189 Encounter for other specified aftercare: Secondary | ICD-10-CM | POA: Insufficient documentation

## 2014-04-23 DIAGNOSIS — M6281 Muscle weakness (generalized): Secondary | ICD-10-CM | POA: Insufficient documentation

## 2014-04-23 NOTE — Progress Notes (Signed)
Occupational Therapy Treatment Patient Details  Name: Nathaniel Frye MRN: [id number] Date of Birth: [birth year]  Today's Date: 04/23/2014 Time: 7342-8768 OT Time Calculation (min): 39 min Manual 1157-2620 (24') Therapeutic Exercises [phone] (14')  Visit#: 13 of 24  Re-eval: 05/07/14    Authorization: BCBS  Authorization Time Period:    Authorization Visit#:   of    Subjective Symptoms/Limitations Symptoms: "Just some pain under my arm here." Pain Assessment Currently in Pain?: Yes Pain Score: 4  Pain Location: Shoulder Pain Orientation: Left Pain Type: Acute pain  Exercise/Treatments Supine Protraction: PROM;5 reps;Strengthening;12 reps Protraction Weight (lbs): 1 Horizontal ABduction: PROM;5 reps;Strengthening;12 reps Horizontal ABduction Weight (lbs): 1 External Rotation: PROM;5 reps;Strengthening;12 reps External Rotation Weight (lbs): 1 Internal Rotation: PROM;5 reps;Strengthening;12 reps Internal Rotation Weight (lbs): 1 Flexion: PROM;5 reps;Strengthening;12 reps Shoulder Flexion Weight (lbs): 1 ABduction: PROM;5 reps;Strengthening;12 reps Shoulder ABduction Weight (lbs): 1 Standing Protraction: Strengthening;12 reps Protraction Weight (lbs): 1 Horizontal ABduction: Strengthening;12 reps Horizontal ABduction Weight (lbs): 1 External Rotation: Strengthening;12 reps External Rotation Weight (lbs): 1 Internal Rotation: Strengthening;12 reps Internal Rotation Weight (lbs): 1 Flexion: Strengthening;12 reps Shoulder Flexion Weight (lbs): 1 ABduction: Strengthening;12 reps Shoulder ABduction Weight (lbs): 1 ROM / Strengthening / Isometric Strengthening UBE (Upper Arm Bike): Level 2, for 2' forward and 2' reverse X to V Arms: 12x with 1# Proximal Shoulder Strengthening, Supine: 12x with 1# weight   Manual Therapy Manual Therapy: Myofascial release Myofascial Release: Myofascial release and manual stretching to left upper arm, biceps region, scapular and  shoulder region to decrease pain and fascial restrictions and increase pain free mobility in left shoulder and arm. Continued MFR to anterior shoulder.upper chest region and axillary/lateral chest region.    Occupational Therapy Assessment and Plan OT Assessment and Plan Clinical Impression Statement: Pt continues to have tightness in lateral L side, but is able to tolerate MFR and stretching. Continued 1# in both supine and standing - pt tolerated well with no increase in pain. Increased UBE to level 2, with no increase in pain. OT Plan: If no increase in pain after previous sesion, increase 1# reps to 15. Add theraband ER/IR   Goals Short Term Goals Short Term Goal 1: Pt will be educated on HEP Short Term Goal 1 Progress: Met Short Term Goal 2: Pt will increase LUE PROM to [facility] to improved ability to dress and bathe self Short Term Goal 2 Progress: Met Short Term Goal 3: Pt will decrease fascial restrictions to mininimal overall for decrased pain with movement Short Term Goal 3 Progress: Met Short Term Goal 4: Pt will have pain of less than 4/10 during driving tasks. Short Term Goal 4 Progress: Met Long Term Goals Long Term Goal 1: Pt will achieve highest level of functioning in all ADL, IADL, work, and leisure tasks. Long Term Goal 1 Progress: Progressing toward goal Long Term Goal 2: Pt will increase LUE AROM to [facility] for increased independence in dressing and bathing himself. Long Term Goal 2 Progress: Met Long Term Goal 3: Pt will achieve strength in LUE of atleast 4+/5 for abilty to engage in all work related activites. Long Term Goal 3 Progress: Progressing toward goal Long Term Goal 4: Pt will decrease fascial restrictions to trace for decresed pain with movement Long Term Goal 4 Progress: Progressing toward goal Long Term Goal 5: Pt will have pain of less than 2/10 during daily activites. Long Term Goal 5 Progress: Progressing toward goal  Problem List Patient Active Problem List    Diagnosis Date Noted  .  Pain in joint, shoulder region 03/11/2014  . Shoulder weakness 03/11/2014  . Decreased range of motion of left shoulder 03/11/2014    End of Session Activity Tolerance: Patient tolerated treatment well General Behavior During Therapy: [facility] for tasks assessed/performed  GO   Bea Graff Samaia Iwata, MS, OTR/L [place] [phone] 04/23/2014, 4:51 PM

## 2014-04-28 ENCOUNTER — Ambulatory Visit (HOSPITAL_COMMUNITY): Payer: BC Managed Care – PPO

## 2014-04-28 ENCOUNTER — Ambulatory Visit (HOSPITAL_COMMUNITY)
Admission: RE | Admit: 2014-04-28 | Discharge: 2014-04-28 | Disposition: A | Discharge status: Home / Self Care | Payer: Worker's Compensation | Source: Ambulatory Visit | Attending: Orthopedic Surgery | Admitting: Orthopedic Surgery

## 2014-04-28 NOTE — Progress Notes (Signed)
Occupational Therapy Treatment Patient Details  Name: Nathaniel Frye MRN: [id number] Date of Birth: May 26, 1971  Today's Date: 04/28/2014 Time: 8242-3536 OT Time Calculation (min): 47 min Manual 1430-1453 (23') Therapeutic Exercises 1453-1517 (24')  Visit#: 14 of 24  Re-eval: 05/07/14    Authorization: BCBS  Authorization Time Period:    Authorization Visit#:   of    Subjective Symptoms/Limitations Symptoms: "its hurting me in my back today.  (shoulder blades)." Limitations: On eval: Dr. Tamera Punt - No elbow flexion against resistance, OK for full elbow ROM, full shoulder AROM/PROM. Week 4 (04/09/14) AROM, no lifting, posterior capsular stretching, restore full AROM of shoulder/elbow; Week 6-8 (04/23/2014-05/07/2014) use low resistance/weight with high repetition, Initiate bicep curls with light resistance progress as tolerated, resistive supination/pronation, ER/IR with theraband, push up plus, cross body diagonals with theraband, Week 10 (05/21/2014) Advance strengthening phase.          -------- 9/22 MD stated no lifting above 10 lbs Pain Assessment Currently in Pain?: Yes Pain Score: 4  Pain Location: Shoulder Pain Orientation: Left Pain Type: Acute pain  Precautions/Restrictions     Exercise/Treatments Supine Protraction: PROM;5 reps;Strengthening;15 reps Protraction Weight (lbs): 1 Horizontal ABduction: PROM;5 reps;Strengthening;15 reps Horizontal ABduction Weight (lbs): 1 External Rotation: PROM;5 reps;Strengthening;15 reps External Rotation Weight (lbs): 1 Internal Rotation: PROM;5 reps;Strengthening;15 reps Internal Rotation Weight (lbs): 1 Flexion: PROM;5 reps;Strengthening;15 reps Shoulder Flexion Weight (lbs): 1 ABduction: PROM;5 reps;Strengthening;15 reps Shoulder ABduction Weight (lbs): 1 Standing Protraction: Strengthening;15 reps Protraction Weight (lbs): 1 Horizontal ABduction: Strengthening;15 reps Horizontal ABduction Weight (lbs): 1 External Rotation:  Strengthening;15 reps;Theraband Theraband Level (Shoulder External Rotation): Level 3 (Green) External Rotation Weight (lbs): 1 Internal Rotation: Strengthening;15 reps;Theraband Theraband Level (Shoulder Internal Rotation): Level 3 (Green) Internal Rotation Weight (lbs): 1 Flexion: Strengthening;15 reps Shoulder Flexion Weight (lbs): 1 ABduction: Strengthening;15 reps Shoulder ABduction Weight (lbs): 1 Extension: Theraband;15 reps Theraband Level (Shoulder Extension): Level 3 (Green) Row: Theraband;15 reps Theraband Level (Shoulder Row): Level 3 (Green) Retraction: Theraband;15 reps Theraband Level (Shoulder Retraction): Level 3 (Green) ROM / Strengthening / Isometric Strengthening "W" Arms: 12x with 1# weights X to V Arms: 15x with 1# Proximal Shoulder Strengthening, Seated: 15x with 1# weight   Stretches Corner Stretch: 2 reps;30 seconds    Manual Therapy Manual Therapy: Myofascial release Myofascial Release: Myofascial release and manual stretching to left upper arm, biceps region, scapular and shoulder region to decrease pain and fascial restrictions and increase pain free mobility in left shoulder and arm. Continued MFR to anterior shoulder.upper chest region and axillary/lateral chest region.     Occupational Therapy Assessment and Plan OT Assessment and Plan Clinical Impression Statement: Increased supine reps to 15 with 1# weight. Pt tolerated well, with no noted soreness from previous session.  pt has continued fascial restrictions on bicep, left lateral abdomen, and scapular regions.  Educated pt on using tennis ball for release of scapular restrictions.  Educated pt on corner stretch for release chest and lateral chest tightness - pt demonstrated and verbalized good understanding.  Added ER/IR thraband.  Pt tolerated well.   OT Plan: increase to 2# weights supine.   Goals Short Term Goals Short Term Goal 1: Pt will be educated on HEP Short Term Goal 1 Progress:  Met Short Term Goal 2: Pt will increase LUE PROM to [facility] to improved ability to dress and bathe self Short Term Goal 2 Progress: Met Short Term Goal 3: Pt will decrease fascial restrictions to mininimal overall for decrased pain with movement Short Term Goal 3 Progress:  Met Short Term Goal 4: Pt will have pain of less than 4/10 during driving tasks. Short Term Goal 4 Progress: Met Long Term Goals Long Term Goal 1: Pt will achieve highest level of functioning in all ADL, IADL, work, and leisure tasks. Long Term Goal 1 Progress: Progressing toward goal Long Term Goal 2: Pt will increase LUE AROM to [facility] for increased independence in dressing and bathing himself. Long Term Goal 2 Progress: Met Long Term Goal 3: Pt will achieve strength in LUE of atleast 4+/5 for abilty to engage in all work related activites. Long Term Goal 3 Progress: Progressing toward goal Long Term Goal 4: Pt will decrease fascial restrictions to trace for decresed pain with movement Long Term Goal 4 Progress: Progressing toward goal Long Term Goal 5: Pt will have pain of less than 2/10 during daily activites. Long Term Goal 5 Progress: Progressing toward goal  Problem List Patient Active Problem List   Diagnosis Date Noted  . Pain in joint, shoulder region 03/11/2014  . Shoulder weakness 03/11/2014  . Decreased range of motion of left shoulder 03/11/2014    End of Session Activity Tolerance: Patient tolerated treatment well General Behavior During Therapy: [facility] for tasks assessed/performed  GO    Bea Graff Severin Bou, [place], OTR/L [place] [phone] 04/28/2014, 3:19 PM

## 2014-05-01 ENCOUNTER — Ambulatory Visit (HOSPITAL_COMMUNITY)
Admission: RE | Admit: 2014-05-01 | Discharge: 2014-05-01 | Disposition: A | Discharge status: Home / Self Care | Payer: Worker's Compensation | Source: Ambulatory Visit | Attending: Orthopedic Surgery | Admitting: Orthopedic Surgery

## 2014-05-01 DIAGNOSIS — R29898 Other symptoms and signs involving the musculoskeletal system: Secondary | ICD-10-CM

## 2014-05-01 DIAGNOSIS — M25512 Pain in left shoulder: Secondary | ICD-10-CM

## 2014-05-01 DIAGNOSIS — M25612 Stiffness of left shoulder, not elsewhere classified: Secondary | ICD-10-CM

## 2014-05-01 NOTE — Progress Notes (Signed)
Note reviewed by clinical instructor and accurately reflects treatment session.  Zyon Grout, OTR/L,CBIS   

## 2014-05-01 NOTE — Progress Notes (Signed)
Occupational Therapy Treatment Patient Details  Name: Nathaniel BreedJuan Aguilar MRN: [id number] Date of Birth: [birth year]  Today's Date: 05/01/2014 Time: 1520-1600 OT Time Calculation (min): 40 min MFR: [id number] 16' Therex: 1536-1600 24'  Visit#: 15 of 24  Re-eval: 05/07/14    Authorization: BCBS  Authorization Time Period:    Authorization Visit#:   of    Subjective Symptoms/Limitations Symptoms: S: It hurts when I'm at work.  Pain Assessment Currently in Pain?: Yes Pain Score: 4  Pain Location: Shoulder Pain Orientation: Left Pain Type: Acute pain  Precautions/Restrictions  Precautions Precaution Comments: Week 4 (04/09/14) AROM, no lifting, posterior capsular stretching, restore full AROM of shoulder/elbow; Week 6-8 (04/23/2014-05/07/2014) use low resistance/weight with high repetition, Initiate bicep curls with light resistance progress as tolerated, resistive supination/pronation, ER/IR with theraband, push up plus, cross body diagonals with theraband, Week 10 (05/21/2014) Advance strengthening phase  Exercise/Treatments Supine Protraction: PROM;5 reps;Strengthening;Weights;12 reps Protraction Weight (lbs): 2 Horizontal ABduction: PROM;5 reps;Strengthening;Weights;12 reps Horizontal ABduction Weight (lbs): 2 External Rotation: PROM;5 reps;Strengthening;Weights;12 reps External Rotation Weight (lbs): 2 Internal Rotation: PROM;5 reps;Strengthening;Weights;12 reps Internal Rotation Weight (lbs): 2 Flexion: PROM;5 reps;Strengthening;Weights;12 reps Shoulder Flexion Weight (lbs): 2 ABduction: PROM;5 reps;Strengthening;Weights;12 reps Shoulder ABduction Weight (lbs): 2   Standing Protraction: Strengthening;15 reps Protraction Weight (lbs): 1 Horizontal ABduction: Strengthening;15 reps Horizontal ABduction Weight (lbs): 1 External Rotation: Strengthening;15 reps External Rotation Weight (lbs): 1 Internal Rotation: Strengthening;15 reps Internal Rotation Weight (lbs): 1 Flexion:  Strengthening;15 reps Shoulder Flexion Weight (lbs): 1 ABduction: Strengthening;15 reps Shoulder ABduction Weight (lbs): 1 Extension: Theraband;15 reps Theraband Level (Shoulder Extension): Level 3 (Green) Row: Theraband;15 reps Theraband Level (Shoulder Row): Level 3 (Green) Retraction: Theraband;15 reps Theraband Level (Shoulder Retraction): Level 3 (Green)   ROM / Strengthening / Isometric Strengthening UBE (Upper Arm Bike): Level 2, for 2' forward and 2' reverse "W" Arms: 15x with 1# X to V Arms: 15x with 1# Proximal Shoulder Strengthening, Supine: 12X with 2# weight Proximal Shoulder Strengthening, Seated: 15x with 1# weight           Occupational Therapy Assessment and Plan OT Assessment and Plan Clinical Impression Statement: A: Increase weight to 2# in supine. Increased w arm repetitions to 15. Patient tolerated treatment well. Pt continues to report pain near inferior scapula, radiating around to his side. Educated pt on using tennis ball for release of scapular restrictions at home.  OT Plan: P: Increase weights to 2# in standing. Attempt prone hughston exercises with 1# weight.     Goals Short Term Goals Short Term Goal 1: Pt will be educated on HEP Short Term Goal 2: Pt will [facility] to improved ability to dress and bathe self Short Term Goal 3: Pt will decrease fascial restrictions to mininimal overall for decrased pain with movement Short Term Goal 4: Pt will have pain of less than 4/10 during driving tasks. Long Term Goals Long Term Goal 1: Pt will achieve highest level of functioning in all ADL, IADL, work, and leisure tasks. Long Term Goal 2: Pt will [facility] for increased independence in dressing and bathing himself. Long Term Goal 3: Pt will achieve strength in LUE of atleast 4+/5 for abilty to engage in all work related activites. Long Term Goal 4: Pt will decrease fascial restrictions to trace for decresed pain with  movement Long Term Goal 5: Pt will have pain of less than 2/10 during daily activites.  Problem List Patient Active Problem List   Diagnosis Date Noted  .  Pain in joint, shoulder region 03/11/2014  . Shoulder weakness 03/11/2014  . Decreased range of motion of left shoulder 03/11/2014    End of Session Activity Tolerance: Patient tolerated treatment well General Behavior During Therapy: [facility] for tasks assessed/performed   Ezra SitesLeslie Parrish Daddario. OT Student  05/01/2014, 4:14 PM

## 2014-05-05 ENCOUNTER — Ambulatory Visit (HOSPITAL_COMMUNITY)
Admission: RE | Admit: 2014-05-05 | Discharge: 2014-05-05 | Disposition: A | Discharge status: Home / Self Care | Payer: Worker's Compensation | Source: Ambulatory Visit | Attending: Orthopedic Surgery | Admitting: Orthopedic Surgery

## 2014-05-05 NOTE — Progress Notes (Signed)
Occupational Therapy Treatment Patient Details  Name: Perez Dirico MRN: [id number] Date of Birth: Jan 16, 1971  Today's Date: 05/05/2014 Time: 8333-8329 OT Time Calculation (min): 41 min Manual 1916-6060 (18') Therapeutic Exercises 1623-1646 (24')  Visit#: 16 of 24  Re-eval: 05/07/14    Authorization: BCBS  Authorization Time Period:    Authorization Visit#:   of    Subjective Symptoms/Limitations Symptoms: "Its about a 5 when I'm working, but at reat about a 3.  The pain under my arm is better." Pain Assessment Currently in Pain?: Yes Pain Score: 3  Pain Location: Shoulder Pain Orientation: Left Pain Type: Acute pain  Exercise/Treatments Supine Protraction: PROM;5 reps;Strengthening;Weights;12 reps Protraction Weight (lbs): 2 Horizontal ABduction: PROM;5 reps;Strengthening;Weights;12 reps Horizontal ABduction Weight (lbs): 2 External Rotation: PROM;5 reps;Strengthening;Weights;12 reps External Rotation Weight (lbs): 2 Internal Rotation: PROM;5 reps;Strengthening;Weights;12 reps Internal Rotation Weight (lbs): 2 Flexion: PROM;5 reps;Strengthening;Weights;12 reps Shoulder Flexion Weight (lbs): 2 ABduction: PROM;5 reps;Strengthening;Weights;12 reps Shoulder ABduction Weight (lbs): 2 Prone  Other Prone Exercises: [place] exercises 1-6, 10 reps each with 1# weight (some pain with flexion and ER/IR) Standing Protraction: Strengthening;12 reps Protraction Weight (lbs): 2 Horizontal ABduction: Strengthening;12 reps Horizontal ABduction Weight (lbs): 2 External Rotation: Strengthening;12 reps External Rotation Weight (lbs): 2 Internal Rotation: Strengthening;12 reps Internal Rotation Weight (lbs): 2 Flexion: Strengthening;12 reps Shoulder Flexion Weight (lbs): 2 ABduction: Strengthening;12 reps Shoulder ABduction Weight (lbs): 2 ROM / Strengthening / Isometric Strengthening UBE (Upper Arm Bike): Level 3, for 2' forard and 2' back "W" Arms: 10x with 2# X to V Arms:  10x with 2# Proximal Shoulder Strengthening, Supine: 15x with 1# weight Proximal Shoulder Strengthening, Seated: 12x with 2# weights   Manual Therapy Manual Therapy: Myofascial release Myofascial Release: Myofascial release and manual stretching to left upper arm, biceps region, scapular and shoulder region to decrease pain and fascial restrictions and increase pain free mobility in left shoulder and arm. Continued MFR to anterior shoulder.upper chest region and axillary/lateral chest region.     Occupational Therapy Assessment and Plan OT Assessment and Plan Clinical Impression Statement: Pt continues to have pain in anterior shoudler regions, but has decreased pain in lateral sides and scapular regions.  Increased to 2# in standing this session.  Pt expressed increased fatigue with exercises, but was able to compelte and tolerated well.  Tolerated well 1# prone [place] exercises.  Increased resistence with UBE.   OT Plan: Re-evaluate.     Goals Short Term Goals Short Term Goal 1: Pt will be educated on HEP Short Term Goal 1 Progress: Met Short Term Goal 2: Pt will increase LUE PROM to [facility] to improved ability to dress and bathe self Short Term Goal 2 Progress: Met Short Term Goal 3: Pt will decrease fascial restrictions to mininimal overall for decrased pain with movement Short Term Goal 3 Progress: Met Short Term Goal 4: Pt will have pain of less than 4/10 during driving tasks. Short Term Goal 4 Progress: Met Long Term Goals Long Term Goal 1: Pt will achieve highest level of functioning in all ADL, IADL, work, and leisure tasks. Long Term Goal 1 Progress: Progressing toward goal Long Term Goal 2: Pt will increase LUE AROM to [facility] for increased independence in dressing and bathing himself. Long Term Goal 2 Progress: Met Long Term Goal 3: Pt will achieve strength in LUE of atleast 4+/5 for abilty to engage in all work related activites. Long Term Goal 3 Progress: Progressing toward  goal Long Term Goal 4: Pt will decrease fascial restrictions to trace  for decresed pain with movement Long Term Goal 4 Progress: Progressing toward goal Long Term Goal 5: Pt will have pain of less than 2/10 during daily activites. Long Term Goal 5 Progress: Progressing toward goal  Problem List Patient Active Problem List   Diagnosis Date Noted  . Pain in joint, shoulder region 03/11/2014  . Shoulder weakness 03/11/2014  . Decreased range of motion of left shoulder 03/11/2014    End of Session Activity Tolerance: Patient tolerated treatment well General Behavior During Therapy: [facility] for tasks assessed/performed  GO    Bea Graff Izael Bessinger, MS, OTR/L [place] [phone] 05/05/2014, 4:51 PM

## 2014-05-07 ENCOUNTER — Ambulatory Visit (HOSPITAL_COMMUNITY)
Admission: RE | Admit: 2014-05-07 | Discharge: 2014-05-07 | Disposition: A | Discharge status: Home / Self Care | Payer: Worker's Compensation | Source: Ambulatory Visit | Attending: Orthopedic Surgery | Admitting: Orthopedic Surgery

## 2014-05-07 NOTE — Evaluation (Addendum)
Occupational Therapy Re-Evaluation and Discharge  Patient Details  Name: Nathaniel Frye MRN: [id number] Date of Birth: 1970-08-05  Today's Date: 05/07/2014 Time: 3500-9381 OT Time Calculation (min): 51 min Manual 8299-3716 (110') Therapeutic Exercises 1623-1633 (10') MMT 9678-9381 ([place] 0175-1025 (12')  Visit#: 17 of 24  Re-eval:    Assessment Diagnosis: s/p L bicep tenodesis  Authorization: [employer]  Authorization Time Period:    Authorization Visit#:   of     Past Medical History:  Past Medical History  Diagnosis Date  . Headache(784.0)   . Anxiety   . Medical history non-contributory    Past Surgical History:  Past Surgical History  Procedure Laterality Date  . No past surgeries    . Labral repair Left 03/02/2014    Procedure: Arthroscopy Left Shoulder with Open biceps tenodesis;  Surgeon: Nita Sells, MD;  Location: [place];  Service: Orthopedics;  Laterality: Left;  Left shoulder arthroscopy debridment labral tear, biceps tenotomy, open biceps tenodesis    Subjective Symptoms/Limitations Symptoms: "its all better. I can move my hand better. I really don't have paiin." Limitations: On eval: Dr. Tamera Punt - No elbow flexion against resistance, OK for full elbow ROM, full shoulder AROM/PROM. Week 4 (04/09/14) AROM, no lifting, posterior capsular stretching, restore full AROM of shoulder/elbow; Week 6-8 (04/23/2014-05/07/2014) use low resistance/weight with high repetition, Initiate bicep curls with light resistance progress as tolerated, resistive supination/pronation, ER/IR with theraband, push up plus, cross body diagonals with theraband, Week 10 (05/21/2014) Advance strengthening phase.          -------- 9/22 MD stated no lifting above 10 lbs Special Tests: FOTO Initial 33/100; current 88/100 Pain Assessment Currently in Pain?: No/denies  Assessment ADL/Vision/Perception ADL ADL Comments: Pt is independent in dressinga nd bathing  tasks, and verbalizes being back to basline with IADL tasks. Sensation/Coordination/Edema Sensation Additional Comments: pt reports normla sensation in his hand and along incision Additional Assessments LUE AROM (degrees) LUE Overall AROM Comments: Pt is assess in standing, with ER/IR shoulder adducted (Previous Eval 9/17) Left Shoulder Flexion: 180 Degrees Left Shoulder ABduction: 180 Degrees Left Shoulder Internal Rotation: 90 Degrees Left Shoulder External Rotation: 88 Degrees Left Elbow Flexion: 148 Left Elbow Extension: 0 LUE Strength LUE Overall Strength Comments: Strength tested in standing (Previous 9/17) Left Shoulder Flexion:  (4+/5 (3/5)) Left Shoulder ABduction: 5/5 ((3/5) Left Shoulder Internal Rotation:  (4+/5 (3/5)) Left Shoulder External Rotation:  (4+/5 (3/5)) Left Elbow Flexion: 5/5 ((3/5)) Left Elbow Extension: 5/5 (3/5) Palpation Palpation: min fascial restrictions in L bicep, L scapular, and Llateral side regions.       Exercise/Treatments Supine Protraction: PROM;5 reps;Strengthening;Weights;15 reps Protraction Weight (lbs): 2 Horizontal ABduction: PROM;5 reps;Strengthening;Weights;15 reps Horizontal ABduction Weight (lbs): 2 External Rotation: PROM;5 reps;Strengthening;Weights;15 reps External Rotation Weight (lbs): 2 Internal Rotation: PROM;5 reps;Strengthening;Weights;15 reps Internal Rotation Weight (lbs): 2 Flexion: PROM;5 reps;Strengthening;Weights;15 reps Shoulder Flexion Weight (lbs): 2 ABduction: PROM;5 reps;Strengthening;Weights;15 reps Shoulder ABduction Weight (lbs): 2    Manual Therapy Manual Therapy: Myofascial release Myofascial Release: Myofascial release and manual stretching to left upper arm, biceps region, scapular and shoulder region to decrease pain and fascial restrictions and increase pain free mobility in left shoulder and arm. Continued MFR to anterior shoulder.upper chest region and axillary/lateral chest region     Occupational Therapy Assessment and Plan OT Assessment and Plan Clinical Impression Statement: Re-evaluation completed this session. pt has progressed well with OT services and verbalizes being able to complete all daily activites without difficulty. He has met all  STG and 4/5 LTG.  Pt's remaining LTG is due to raming fascial restrictions in shoulder.  Pt verbalizes feeling weak with lifting heavy objects - assured pt increased sterngth will come with more time.  Pt verbalizes feeling comfortable with HEP and has no questions.  Educated pt on ability to slowly increase reps and weight to increase strength. Pt had no further questions. OT Plan: Pt is discharged from OT services.   Goals Short Term Goals Short Term Goal 1: Pt will be educated on HEP Short Term Goal 1 Progress: Met Short Term Goal 2: Pt will increase LUE PROM to [facility] to improved ability to dress and bathe self Short Term Goal 2 Progress: Met Short Term Goal 3: Pt will decrease fascial restrictions to mininimal overall for decrased pain with movement Short Term Goal 3 Progress: Met Short Term Goal 4: Pt will have pain of less than 4/10 during driving tasks. Short Term Goal 4 Progress: Met Long Term Goals Long Term Goal 1: Pt will achieve highest level of functioning in all ADL, IADL, work, and leisure tasks. Long Term Goal 1 Progress: Met Long Term Goal 2: Pt will increase LUE AROM to [facility] for increased independence in dressing and bathing himself. Long Term Goal 2 Progress: Met Long Term Goal 3: Pt will achieve strength in LUE of atleast 4+/5 for abilty to engage in all work related activites. Long Term Goal 3 Progress: Met Long Term Goal 4: Pt will decrease fascial restrictions to trace for decresed pain with movement Long Term Goal 4 Progress: Partly met Long Term Goal 5: Pt will have pain of less than 2/10 during daily activites. Long Term Goal 5 Progress: Met  Problem List Patient Active Problem List   Diagnosis Date  Noted  . Pain in joint, shoulder region 03/11/2014  . Shoulder weakness 03/11/2014  . Decreased range of motion of left shoulder 03/11/2014    End of Session Activity Tolerance: Patient tolerated treatment well General Behavior During Therapy: [facility] for tasks assessed/performed  GO    Bea Graff Gwyn Hieronymus, MS, OTR/L [place] [phone] 05/07/2014, 5:19 PM   Physician Documentation Your signature is required to indicate approval of the treatment plan as stated above.  Please sign and either send electronically or make a copy of this report for your files and return this physician signed original.  Please mark one 1.__approve of plan  2. ___approve of plan with the following conditions.   ______________________________                                                          _____________________ Physician Signature                                                                                                             Date

## 2014-05-12 ENCOUNTER — Ambulatory Visit (HOSPITAL_COMMUNITY): Payer: BC Managed Care – PPO

## 2014-05-14 ENCOUNTER — Ambulatory Visit (HOSPITAL_COMMUNITY): Payer: BC Managed Care – PPO

## 2014-05-18 ENCOUNTER — Ambulatory Visit (HOSPITAL_COMMUNITY): Payer: BC Managed Care – PPO

## 2014-05-21 ENCOUNTER — Ambulatory Visit (HOSPITAL_COMMUNITY): Payer: BC Managed Care – PPO

## 2014-11-29 ENCOUNTER — Encounter (HOSPITAL_COMMUNITY): Payer: Self-pay | Admitting: Emergency Medicine

## 2014-11-29 ENCOUNTER — Emergency Department (HOSPITAL_COMMUNITY)
Admission: EM | Admit: 2014-11-29 | Discharge: 2014-11-29 | Disposition: A | Discharge status: Home / Self Care | Payer: BLUE CROSS/BLUE SHIELD | Attending: Emergency Medicine | Admitting: Emergency Medicine

## 2014-11-29 ENCOUNTER — Emergency Department (HOSPITAL_COMMUNITY): Payer: BLUE CROSS/BLUE SHIELD

## 2014-11-29 DIAGNOSIS — R1013 Epigastric pain: Secondary | ICD-10-CM | POA: Diagnosis present

## 2014-11-29 DIAGNOSIS — D72829 Elevated white blood cell count, unspecified: Secondary | ICD-10-CM

## 2014-11-29 DIAGNOSIS — F419 Anxiety disorder, unspecified: Secondary | ICD-10-CM | POA: Diagnosis not present

## 2014-11-29 DIAGNOSIS — Z792 Long term (current) use of antibiotics: Secondary | ICD-10-CM | POA: Insufficient documentation

## 2014-11-29 DIAGNOSIS — K529 Noninfective gastroenteritis and colitis, unspecified: Secondary | ICD-10-CM | POA: Diagnosis not present

## 2014-11-29 DIAGNOSIS — Z79899 Other long term (current) drug therapy: Secondary | ICD-10-CM | POA: Insufficient documentation

## 2014-11-29 DIAGNOSIS — R109 Unspecified abdominal pain: Secondary | ICD-10-CM

## 2014-11-29 LAB — COMPREHENSIVE METABOLIC PANEL
ALBUMIN: 3.9 g/dL (ref 3.5–5.0)
ALT: 37 U/L (ref 17–63)
ANION GAP: 8 (ref 5–15)
AST: 25 U/L (ref 15–41)
Alkaline Phosphatase: 64 U/L (ref 38–126)
BUN: 8 mg/dL (ref 6–20)
CHLORIDE: 100 mmol/L — AB (ref 101–111)
CO2: 28 mmol/L (ref 22–32)
Calcium: 8.8 mg/dL — ABNORMAL LOW (ref 8.9–10.3)
Creatinine, Ser: 0.89 mg/dL (ref 0.61–1.24)
GLUCOSE: 123 mg/dL — AB (ref 70–99)
POTASSIUM: 4.1 mmol/L (ref 3.5–5.1)
Sodium: 136 mmol/L (ref 135–145)
Total Bilirubin: 0.6 mg/dL (ref 0.3–1.2)
Total Protein: 7.5 g/dL (ref 6.5–8.1)

## 2014-11-29 LAB — CBC WITH DIFFERENTIAL/PLATELET
BASOS ABS: 0 10*3/uL (ref 0.0–0.1)
BASOS PCT: 0 % (ref 0–1)
EOS ABS: 0 10*3/uL (ref 0.0–0.7)
EOS PCT: 0 % (ref 0–5)
HEMATOCRIT: 43.9 % (ref 39.0–52.0)
HEMOGLOBIN: 15 g/dL (ref 13.0–17.0)
Lymphocytes Relative: 12 % (ref 12–46)
Lymphs Abs: 1.4 10*3/uL (ref 0.7–4.0)
MCH: 30.9 pg (ref 26.0–34.0)
MCHC: 34.2 g/dL (ref 30.0–36.0)
MCV: 90.5 fL (ref 78.0–100.0)
MONOS PCT: 9 % (ref 3–12)
Monocytes Absolute: 1.1 10*3/uL — ABNORMAL HIGH (ref 0.1–1.0)
Neutro Abs: 9.8 10*3/uL — ABNORMAL HIGH (ref 1.7–7.7)
Neutrophils Relative %: 79 % — ABNORMAL HIGH (ref 43–77)
Platelets: 223 10*3/uL (ref 150–400)
RBC: 4.85 MIL/uL (ref 4.22–5.81)
RDW: 12.8 % (ref 11.5–15.5)
WBC: 12.3 10*3/uL — ABNORMAL HIGH (ref 4.0–10.5)

## 2014-11-29 LAB — LIPASE, BLOOD: LIPASE: 17 U/L — AB (ref 22–51)

## 2014-11-29 MED ORDER — PANTOPRAZOLE SODIUM 40 MG IV SOLR
40.0000 mg | Freq: Once | INTRAVENOUS | Status: AC
  Administered 2014-11-29: 40 mg via INTRAVENOUS
  Filled 2014-11-29: qty 40

## 2014-11-29 MED ORDER — METRONIDAZOLE IN NACL 5-0.79 MG/ML-% IV SOLN
500.0000 mg | Freq: Once | INTRAVENOUS | Status: AC
  Administered 2014-11-29: 500 mg via INTRAVENOUS
  Filled 2014-11-29: qty 100

## 2014-11-29 MED ORDER — ONDANSETRON HCL 4 MG/2ML IJ SOLN
4.0000 mg | Freq: Once | INTRAMUSCULAR | Status: AC
  Administered 2014-11-29: 4 mg via INTRAVENOUS
  Filled 2014-11-29: qty 2

## 2014-11-29 MED ORDER — MORPHINE SULFATE 4 MG/ML IJ SOLN
4.0000 mg | INTRAMUSCULAR | Status: DC | PRN
  Administered 2014-11-29: 4 mg via INTRAVENOUS
  Filled 2014-11-29: qty 1

## 2014-11-29 MED ORDER — HYDROCODONE-ACETAMINOPHEN 5-325 MG PO TABS: 2.0000 | ORAL_TABLET | ORAL | Status: AC | PRN

## 2014-11-29 MED ORDER — LEVOFLOXACIN IN D5W 750 MG/150ML IV SOLN
750.0000 mg | Freq: Once | INTRAVENOUS | Status: AC
  Administered 2014-11-29: 750 mg via INTRAVENOUS
  Filled 2014-11-29: qty 150

## 2014-11-29 MED ORDER — IOHEXOL 300 MG/ML  SOLN
100.0000 mL | Freq: Once | INTRAMUSCULAR | Status: AC | PRN
  Administered 2014-11-29: 100 mL via INTRAVENOUS

## 2014-11-29 MED ORDER — METRONIDAZOLE 500 MG PO TABS: 500.0000 mg | ORAL_TABLET | Freq: Two times a day (BID) | ORAL | Status: DC

## 2014-11-29 MED ORDER — ONDANSETRON 4 MG PO TBDP: 4.0000 mg | ORAL_TABLET | Freq: Three times a day (TID) | ORAL | Status: AC | PRN

## 2014-11-29 MED ORDER — SODIUM CHLORIDE 0.9 % IV BOLUS (SEPSIS)
1000.0000 mL | Freq: Once | INTRAVENOUS | Status: AC
  Administered 2014-11-29: 1000 mL via INTRAVENOUS

## 2014-11-29 MED ORDER — GLYCOPYRROLATE 0.2 MG/ML IJ SOLN
0.1000 mg | Freq: Once | INTRAMUSCULAR | Status: AC
  Administered 2014-11-29: 0.1 mg via INTRAVENOUS
  Filled 2014-11-29: qty 1

## 2014-11-29 MED ORDER — CIPROFLOXACIN HCL 500 MG PO TABS: 500.0000 mg | ORAL_TABLET | Freq: Two times a day (BID) | ORAL | Status: DC

## 2014-11-29 MED ORDER — IOHEXOL 300 MG/ML  SOLN
25.0000 mL | Freq: Once | INTRAMUSCULAR | Status: AC | PRN
  Administered 2014-11-29: 25 mL via ORAL

## 2014-11-29 MED ORDER — SODIUM CHLORIDE 0.9 % IJ SOLN
INTRAMUSCULAR | Status: AC
  Filled 2014-11-29: qty 500

## 2014-11-29 NOTE — Discharge Instructions (Signed)
Colitis  (Colitis) La colitis es la inflamacin del colon. Puede ser Neomia Dearuna afeccin breve o de larga duracin (crnica). La enfermedad de Crohn y la colitis ulcerosa son 2 tipos de colitis crnica. Requieren Enbridge Energytratamiento permanente.  CAUSAS  Hay numerosas causas que originan este problema, entre las que se incluyen:   Virus.  Grmenes (bacterias).  Reacciones a ciertos medicamentos. SNTOMAS   Diarrea.  Sangrado intestinal.  Dolor.  Grant RutsFiebre.  Devuelve la comida (vmitos).  Cansancio (fatiga).  Prdida de peso.  Obstruccin intestinal. DIAGNSTICO  El diagnstico de colitis se basa en el examen y en anlisis de materia fecal o de sangre. Tambin podra ser necesario tomar radiografas, una tomografa computada y Neomia Dearuna colonoscopa.  TRATAMIENTO  El tratamiento puede incluir:   Administracin de lquidos por la vena (va intravenosa).  Reposo del intestino (no comer ni beber nada durante cierto tiempo).  Medicamentos para Primary school teachercalmar el dolor y la diarrea.  Medicamentos que destruyen grmenes (antibiticos).  Corticoides.  Ciruga. INSTRUCCIONES PARA EL CUIDADO EN EL HOGAR   Descanse lo suficiente.  Beba gran cantidad de lquido para mantener la orina de tono claro o color amarillo plido.  Consuma una dieta bien balanceada.  Comunquese con su mdico para realizar controles segn las indicaciones. SOLICITE ATENCIN MDICA DE INMEDIATO SI:   Comienza a sentir escalofros.  La temperatura oral le sube a ms de 38,9 C (102 F), y no puede bajarla con medicamentos.  Siente debilidad extrema, se desmaya o est deshidratado.  Tiene vmitos persistentes.  Siente mucho dolor en el vientre (abdomen) o elimina heces sanguinolentas o de aspecto alquitranado. ASEGRESE DE QUE:   Comprende estas instrucciones.  Controlar su enfermedad.  Recibir ayuda de inmediato si no mejora o si empeora. Document Released: 07/10/2005 Document Revised: 03/12/2013 [facility] Patient  Information 2015 [place] [employer]. This information is not intended to replace advice given to you by your health care provider. Make sure you discuss any questions you have with your health care provider.

## 2014-11-29 NOTE — ED Provider Notes (Addendum)
CSN: [id number]     Arrival date & time 11/29/14  [id number] History  This chart was scribed for Nathaniel PorterMark Haile Bosler, MD by Ronney LionSuzanne Le, ED Scribe. This patient was seen in room APA19/APA19 and the patient's care was started at 8:57 AM.    Chief Complaint  Patient presents with  . Abdominal Pain   The history is provided by the patient, a relative and medical records. No language interpreter was used.     HPI Comments: Nathaniel Frye is a 44 y.o. male with a PMHx of anxiety who presents to the Emergency Department complaining of intermittent, sharp, cramping epigastric abdominal pain that began 2 days ago. He also complains of associated constant nausea, vomiting, and diarrhea. Patient endorses a fever that occurred yesterday and the day before, but that has since resolved. Per nursing notes, [facility] [place] [facility] where he had a negative flu test done and was given Zofran 4 mg and Dicyclomine 20 mg, which have provided no relief. The flu test was the only test that was done yesterday. Per daughter, patient takes anti-anxiety medication regularly, but has no other chronic medical conditions. She denies a history of abdominal surgeries. He has NKDA. He denies cough or blood in his stool.   Past Medical History  Diagnosis Date  . Headache(784.0)   . Anxiety   . Medical history non-contributory    Past Surgical History  Procedure Laterality Date  . No past surgeries    . Labral repair Left 03/02/2014    Procedure: Arthroscopy Left Shoulder with Open biceps tenodesis;  Surgeon: Mable ParisJustin William Chandler, MD;  Location: [facility];  Service: Orthopedics;  Laterality: Left;  Left shoulder arthroscopy debridment labral tear, biceps tenotomy, open biceps tenodesis   History reviewed. No pertinent family history. History  Substance Use Topics  . Smoking status: Never Smoker   . Smokeless tobacco: Never Used  . Alcohol Use: Yes     Comment: occ    Review of  Systems  Constitutional: Positive for fever (resolved). Negative for chills, diaphoresis, appetite change and fatigue.  HENT: Negative for mouth sores, sore throat and trouble swallowing.   Eyes: Negative for visual disturbance.  Respiratory: Negative for cough, chest tightness, shortness of breath and wheezing.   Cardiovascular: Negative for chest pain.  Gastrointestinal: Positive for nausea, vomiting, abdominal pain and diarrhea. Negative for blood in stool and abdominal distention.  Endocrine: Negative for polydipsia, polyphagia and polyuria.  Genitourinary: Negative for dysuria, frequency and hematuria.  Musculoskeletal: Negative for gait problem.  Skin: Negative for color change, pallor and rash.  Neurological: Negative for dizziness, syncope, light-headedness and headaches.  Hematological: Does not bruise/bleed easily.  Psychiatric/Behavioral: Negative for behavioral problems and confusion.      Allergies  Review of patient's allergies indicates no known allergies.  Home Medications   Prior to Admission medications   Medication Sig Start Date End Date Taking? Authorizing Provider  acetaminophen (TYLENOL) 500 MG tablet Take 1,000 mg by mouth every 6 (six) hours as needed for moderate pain or fever.   Yes Historical Provider, MD  dicyclomine (BENTYL) 20 MG tablet Take 20 mg by mouth 4 (four) times daily.   Yes Historical Provider, MD  Multiple Vitamin (MULTIVITAMIN WITH MINERALS) TABS tablet Take 1 tablet by mouth daily.   Yes Historical Provider, MD  PARoxetine (PAXIL) 20 MG tablet Take 20 mg by mouth at bedtime.   Yes Historical Provider, MD  ciprofloxacin (CIPRO) 500 MG tablet Take 1  tablet (500 mg total) by mouth 2 (two) times daily. 11/29/14   Nathaniel Porter, MD  docusate sodium (COLACE) 100 MG capsule Take 1 capsule (100 mg total) by mouth 3 (three) times daily as needed. Patient not taking: Reported on 11/29/2014 03/02/14   Jiles Harold, PA-C  HYDROcodone-acetaminophen  (NORCO/VICODIN) 5-325 MG per tablet Take 2 tablets by mouth every 4 (four) hours as needed. 11/29/14   Nathaniel Porter, MD  metroNIDAZOLE (FLAGYL) 500 MG tablet Take 1 tablet (500 mg total) by mouth 2 (two) times daily. 11/29/14   Nathaniel Porter, MD  ondansetron (ZOFRAN ODT) 4 MG disintegrating tablet Take 1 tablet (4 mg total) by mouth every 8 (eight) hours as needed for nausea. 11/29/14   Nathaniel Porter, MD  oxyCODONE-acetaminophen (ROXICET) 5-325 MG per tablet Take 1-2 tablets by mouth every 4 (four) hours as needed for severe pain. Patient not taking: Reported on 11/29/2014 03/02/14   Jiles Harold, PA-C   BP 111/63 mmHg  Pulse 59  Temp(Src) 98.7 F (37.1 C) (Oral)  Resp 16  Ht  (1.702 m)  Wt 197 lb (89.359 kg)  BMI 30.85 kg/m2  SpO2 97% Physical Exam  Constitutional: He is oriented to person, place, and time. He appears well-developed and well-nourished. No distress.  HENT:  Head: Normocephalic.  Eyes: Conjunctivae are normal. Pupils are equal, round, and reactive to light. No scleral icterus.  Neck: Normal range of motion. Neck supple. No thyromegaly present.  Cardiovascular: Normal rate and regular rhythm.  Exam reveals no gallop and no friction rub.   No murmur heard. Pulmonary/Chest: Effort normal and breath sounds normal. No respiratory distress. He has no wheezes. He has no rales.  Abdominal: Soft. He exhibits no distension. Bowel sounds are increased. There is no tenderness. There is no rebound.  Nontender RLQ. Hyperactive bowel sounds. Patient indicates pain in his epigastrium, but nontender on exam.  Musculoskeletal: Normal range of motion.  Neurological: He is alert and oriented to person, place, and time.  Skin: Skin is warm and dry. No rash noted.  Psychiatric: He has a normal mood and affect. His behavior is normal.  Nursing note and vitals reviewed.   ED Course  Procedures (including critical care time)  DIAGNOSTIC STUDIES: Oxygen Saturation is 98% on RA, normal by my  interpretation.    COORDINATION OF CARE: 8:59 AM - Discussed treatment plan with pt and his family at bedside which includes blood tests and nausea medication, and pt and his family agreed to plan.  10:43 AM-Discussed blood test results with patient and his family. Pt still states he is currently having "bad pain" and indicates RLQ. Given this, I believe a CT abdomen with contrast is indicated and discussed this, including the duration of the procedure, with family. Pt and his family verbalize understanding,  and agreed to plan.    Labs Review Labs Reviewed  CBC WITH DIFFERENTIAL/PLATELET - Abnormal; Notable for the following:    WBC 12.3 (*)    Neutrophils Relative % 79 (*)    Neutro Abs 9.8 (*)    Monocytes Absolute 1.1 (*)    All other components within normal limits  COMPREHENSIVE METABOLIC PANEL - Abnormal; Notable for the following:    Chloride 100 (*)    Glucose, Bld 123 (*)    Calcium 8.8 (*)    All other components within normal limits  LIPASE, BLOOD - Abnormal; Notable for the following:    Lipase 17 (*)    All other components within normal limits  Medications  morphine 4 MG/ML injection 4 mg (4 mg Intravenous Given 11/29/14 1102)  sodium chloride 0.9 % injection (not administered)  levofloxacin (LEVAQUIN) IVPB 750 mg (750 mg Intravenous New Bag/Given 11/29/14 1349)  ondansetron (ZOFRAN) injection 4 mg (4 mg Intravenous Given 11/29/14 0925)  glycopyrrolate (ROBINUL) injection 0.1 mg (0.1 mg Intravenous Given 11/29/14 0926)  pantoprazole (PROTONIX) injection 40 mg (40 mg Intravenous Given 11/29/14 0921)  sodium chloride 0.9 % bolus 1,000 mL (0 mLs Intravenous Stopped 11/29/14 1021)  ondansetron (ZOFRAN) injection 4 mg (4 mg Intravenous Given 11/29/14 1058)  iohexol (OMNIPAQUE) 300 MG/ML solution 25 mL (25 mLs Oral Contrast Given 11/29/14 1124)  iohexol (OMNIPAQUE) 300 MG/ML solution 100 mL (100 mLs Intravenous Contrast Given 11/29/14 1125)  metroNIDAZOLE (FLAGYL) IVPB 500 mg (500 mg  Intravenous New Bag/Given 11/29/14 1225)     Imaging Review Ct Abdomen Pelvis W Contrast  11/29/2014   CLINICAL DATA:  Intermittent sharp cramping epigastric abdominal pain beginning 2 days ago associated with nausea, vomiting, and diarrhea, fever yesterday, leukocytosis  EXAM: CT ABDOMEN AND PELVIS WITH CONTRAST  TECHNIQUE: Multidetector CT imaging of the abdomen and pelvis was performed using the standard protocol following bolus administration of intravenous contrast. Sagittal and coronal MPR images reconstructed from axial data set.  CONTRAST:  25mL OMNIPAQUE IOHEXOL 300 MG/ML SOLN P.O., 100mL OMNIPAQUE IOHEXOL 300 MG/ML SOLN IV  COMPARISON:  None  FINDINGS: Minimal dependent densities in lower lobes.  Diffuse fatty infiltration of liver.  10 mm cyst inferior pole LEFT kidney.  Liver, gallbladder, spleen, pancreas, kidneys, and adrenal glands normal.  Normal appendix.  Small umbilical hernia containing fat.  Diffuse colonic wall thickening, least affecting the rectum, compatible with colitis.  Few scattered normal size mesenteric lymph nodes medial to RIGHT colon.  Stomach and small bowel loops normal appearance.  Unremarkable bladder and ureters.  No mass, adenopathy, free air, or free fluid.  Osseous structures unremarkable.  IMPRESSION: Diffuse colonic wall thickening compatible with colitis; differential diagnosis includes infection inflammatory bowel disease.  Fatty infiltration of liver.  Small umbilical hernia containing fat.   Electronically Signed   By: Ulyses SouthwardMark  Boles M.D.   On: 11/29/2014 11:47     MDM   Final diagnoses:  Leukocytosis  Abdominal pain  Colitis     I personally performed the services described in this documentation, which was scribed in my presence. The recorded information has been reviewed and is accurate.  CT results reviewed with the patient. This shows a focal segment colitis of the right ascending colon. No sign of appendicitis, diverticulitis, or localized  suppurative infection, abscess, or perforation. On recheck he was feeling better. States his pain was down to a 0 or 1. Given IV Flagyl, and Levaquin. Plan is discharge home. Prescriptions Flagyl, Cipro, Vicodin, Zofran, low residue primarily liquid diet. Recheck here with any worsening symptoms. Primary care follow-up discussed as well.  As his: Does have a markedly abnormal appearance on CT I recommended he see GI in follow-up. He was given this information at discharge.  I personally performed the services described in this documentation, which was scribed in my presence. The recorded information has been reviewed and is accurate.   Nathaniel PorterMark Koron Godeaux, MD 11/29/14 1421  Nathaniel PorterMark Rane Blitch, MD 11/29/14 [phone]

## 2014-11-29 NOTE — ED Notes (Signed)
Patient c/o lower abd pain with nausea vomiting, and diarrhea that started Friday. [facility] seen at [facility] [place] yesterday, tested for flu and given Zofran ODT 4 mg and Dicylomine 20mg . Per patient no improvement. Family reports patient has had intermittent fever. Took 1000mg  tylenol yesterday at 3pm.

## 2015-08-15 IMAGING — CR DG FOOT COMPLETE 3+V*R*
3 series · 3 of 3 positions shown · non-contrast
Comparison: None.

CLINICAL DATA: Pain post trauma

RIGHT FOOT COMPLETE - 3+ VIEW

[view not recorded (1 of 3)]
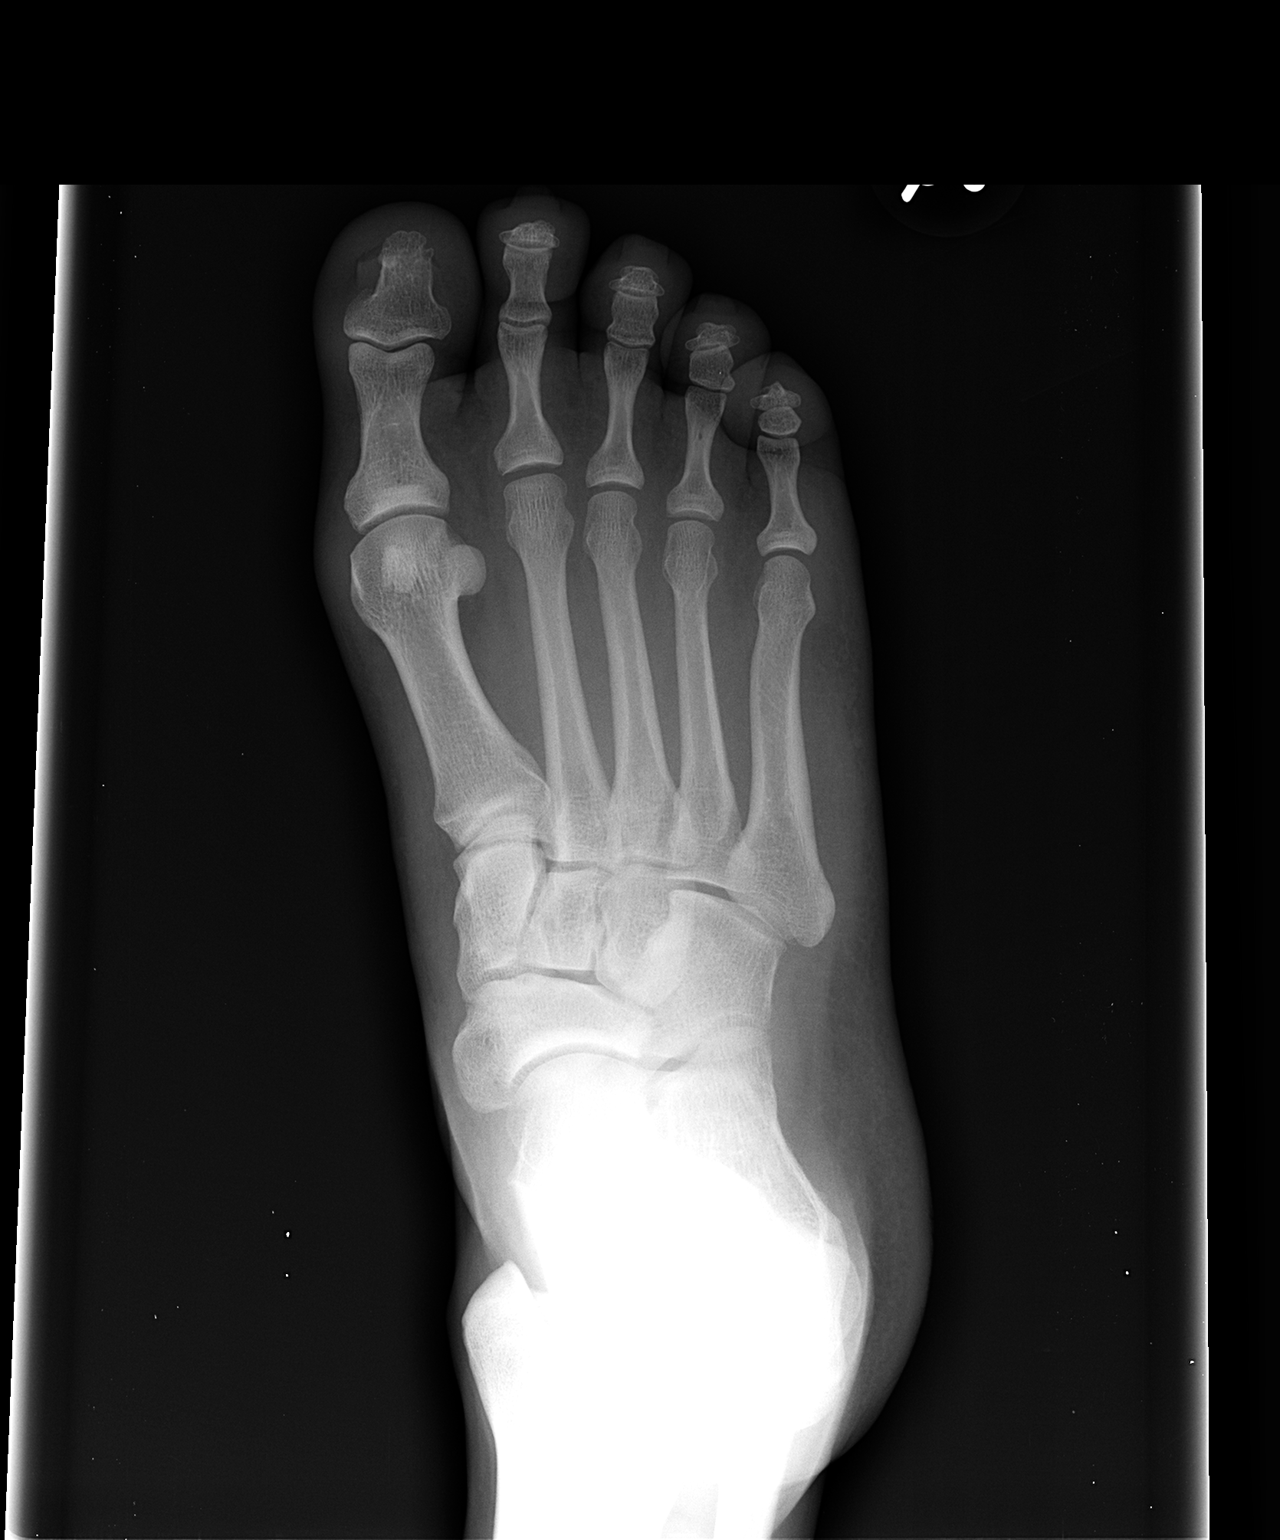

[view not recorded (2 of 3)]
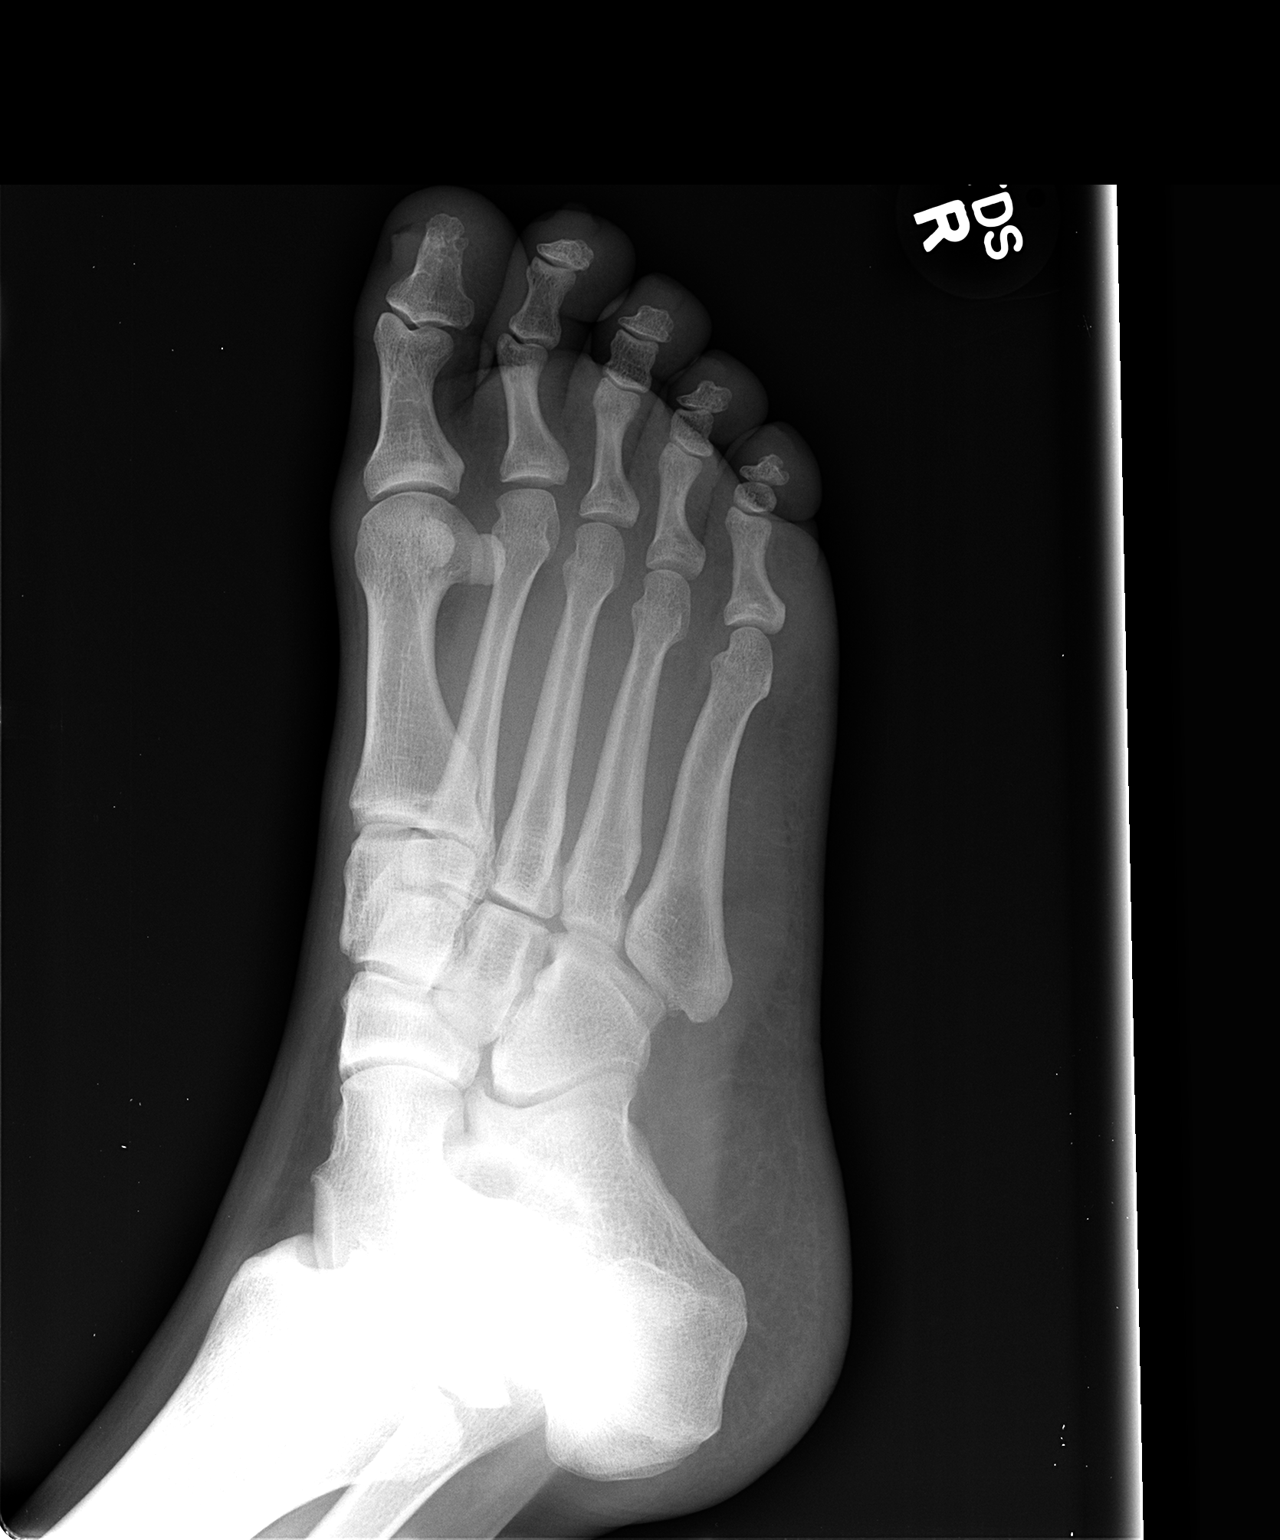

[view not recorded (3 of 3)]
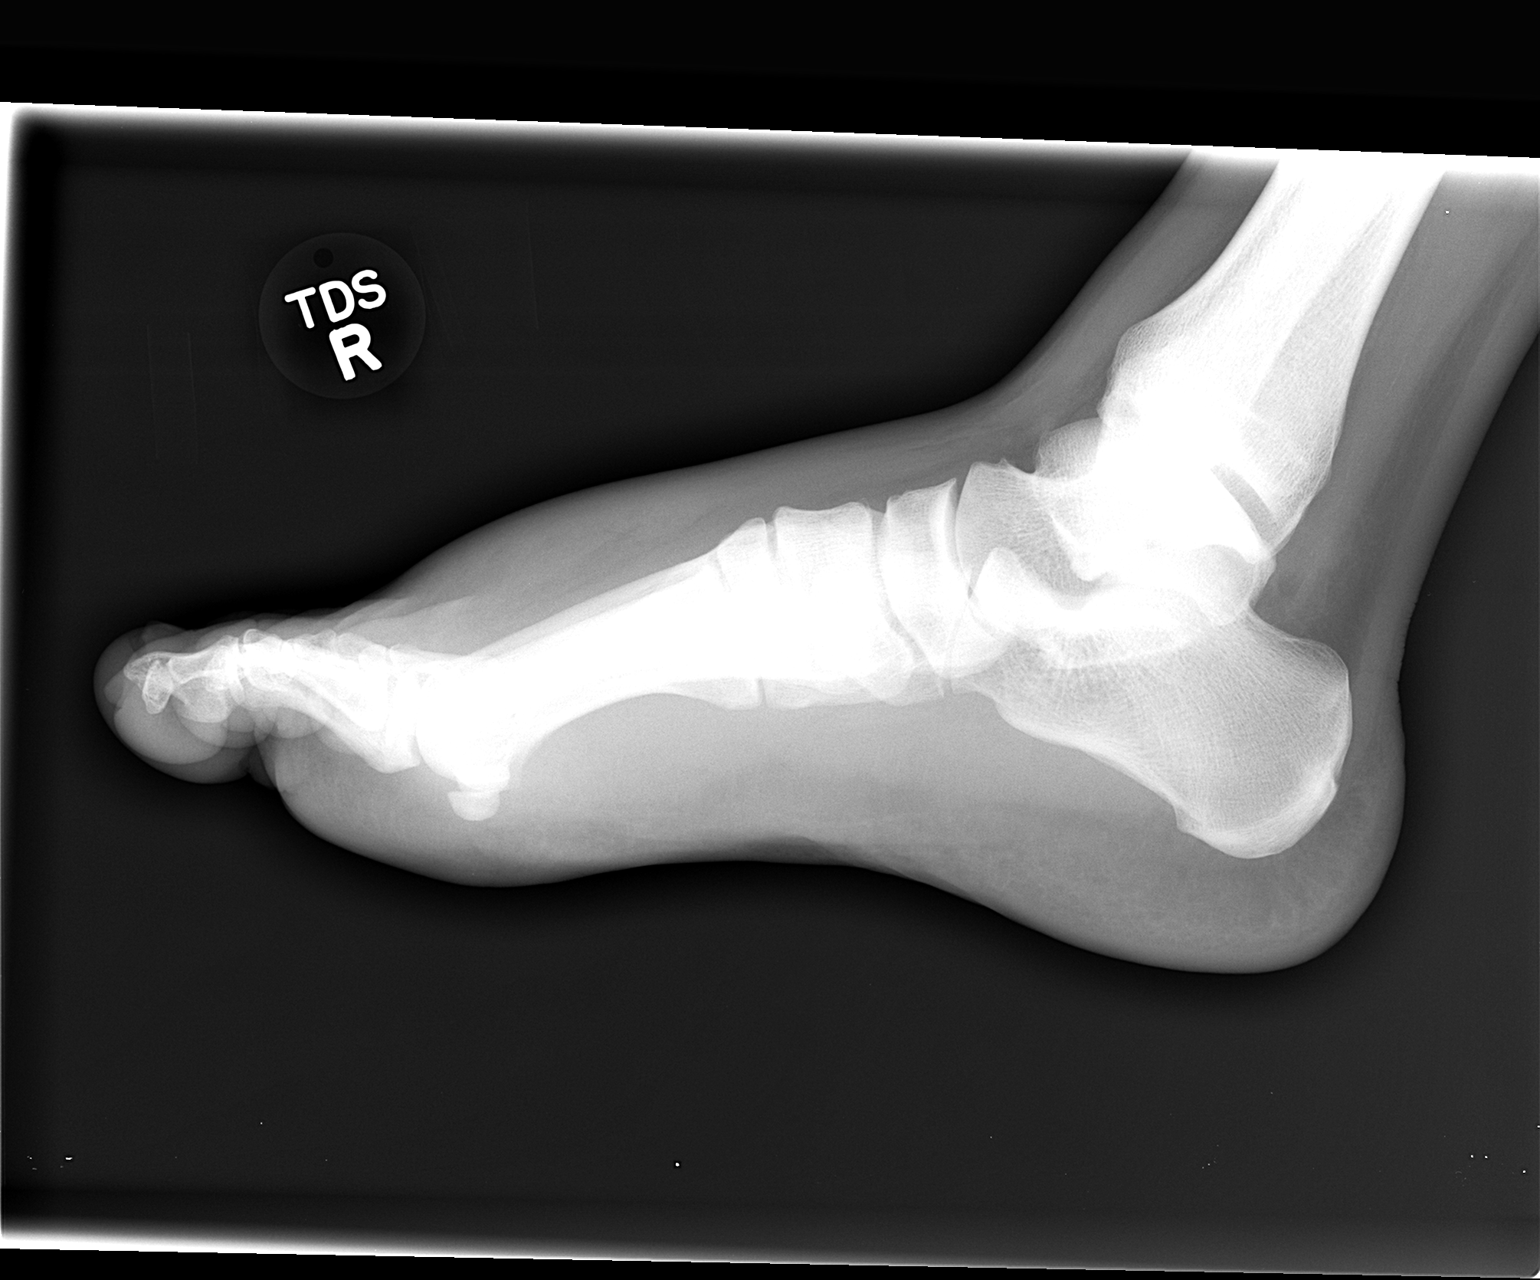

[3 of 3 positions shown; findings below may reference images not displayed]

FINDINGS: Frontal, oblique, and lateral views were obtained.
There is no fracture or dislocation.  Joint spaces appear intact.
No erosive change.
IMPRESSION: No abnormality noted.

## 2016-04-05 ENCOUNTER — Encounter (HOSPITAL_COMMUNITY): Payer: Self-pay | Admitting: Physical Therapy

## 2016-04-05 ENCOUNTER — Ambulatory Visit (HOSPITAL_COMMUNITY): Payer: BLUE CROSS/BLUE SHIELD | Attending: Orthopaedic Surgery | Admitting: Physical Therapy

## 2016-04-05 DIAGNOSIS — R2681 Unsteadiness on feet: Secondary | ICD-10-CM | POA: Insufficient documentation

## 2016-04-05 DIAGNOSIS — M25561 Pain in right knee: Secondary | ICD-10-CM | POA: Insufficient documentation

## 2016-04-05 DIAGNOSIS — R29898 Other symptoms and signs involving the musculoskeletal system: Secondary | ICD-10-CM | POA: Diagnosis present

## 2016-04-05 DIAGNOSIS — R2689 Other abnormalities of gait and mobility: Secondary | ICD-10-CM | POA: Insufficient documentation

## 2016-04-05 NOTE — Therapy (Signed)
[facility] [address] [place], [place], [address] Phone: [phone]   Fax:  [phone]  Physical Therapy Evaluation  Patient Details  Name: Harel Repetto MRN: [id number] Date of Birth: 06/27/1971 Referring Provider: Marcene Corning, MD  Encounter Date: 04/05/2016      PT End of Session - 04/05/16 1437    Visit Number 1   Number of Visits 8   Date for PT Re-Evaluation 04/19/16   Authorization Type BCBS   Authorization Time Period 04/05/16 to 05/05/16   PT Start Time 1305   PT Stop Time 1344   PT Time Calculation (min) 39 min   Activity Tolerance Patient tolerated treatment well   Behavior During Therapy [facility] for tasks assessed/performed      Past Medical History:  Diagnosis Date  . Anxiety   . Headache(784.0)   . Medical history non-contributory     Past Surgical History:  Procedure Laterality Date  . LABRAL REPAIR Left 03/02/2014   Procedure: Arthroscopy Left Shoulder with Open biceps tenodesis;  Surgeon: Mable Paris, MD;  Location: [facility];  Service: Orthopedics;  Laterality: Left;  Left shoulder arthroscopy debridment labral tear, biceps tenotomy, open biceps tenodesis  . NO PAST SURGERIES      There were no vitals filed for this visit.       Subjective Assessment - 04/05/16 1308    Subjective ~8 months ago pt reports pain that come out of nowhere. He underwent meniscectomy on 03/24/16. He reports things are going well since the surgery. Just some pain. He has been trying to work on walking without crutches but his knee will buckle on him occasionally.    Pertinent History anxiety   Limitations Walking   How long can you sit comfortably? unlimited    How long can you stand comfortably? "10+ minutes probably"    How long can you walk comfortably? 10 minutes or so    Diagnostic tests Xray before surgery    Patient Stated Goals Regain use of Rt knee/leg   Currently in Pain? Yes   Pain Score 5    Pain Location Knee   Pain Orientation Right   Pain Descriptors / Indicators Squeezing;Aching   Pain Type Surgical pain   Pain Radiating Towards None    Pain Onset 1 to 4 weeks ago   Pain Frequency Intermittent   Aggravating Factors  walking   Pain Relieving Factors rest   Effect of Pain on Daily Activities unsafe walking             [facility] PT Assessment - 04/05/16 0001      Assessment   Medical Diagnosis Rt knee scope   Referring Provider Marcene Corning, MD   Onset Date/Surgical Date 03/24/16   Next MD Visit 05/06/16   Prior Therapy none      Precautions   Precautions None     Restrictions   Weight Bearing Restrictions No     Balance Screen   Has the patient fallen in the past 6 months No   Has the patient had a decrease in activity level because of a fear of falling?  No   Is the patient reluctant to leave their home because of a fear of falling?  No     Home Environment   Living Environment Private residence   Additional Comments ~5 STE     Prior Function   Level of Independence Independent   Vocation Full time employment   Vocation Requirements  dairy farm     Cognition   Overall Cognitive Status Within Functional Limits for tasks assessed     Observation/Other Assessments   Focus on Therapeutic Outcomes (FOTO)  64% limited      Sensation   Light Touch Appears Intact   Additional Comments Pt reporting heaviness and sensation of numbness on his Rt foot when standing/walking or sitting with it hanging.      ROM / Strength   AROM / PROM / Strength AROM;Strength     AROM   AROM Assessment Site Knee   Right/Left Knee --  Rt knee ROM full and pain free     Strength   Strength Assessment Site Knee;Hip;Ankle   Right/Left Hip Right;Left   Right Hip Flexion 5/5   Right Hip Extension 5/5   Right Hip ABduction 5/5   Left Hip Flexion 5/5   Left Hip Extension 5/5   Left Hip ABduction 5/5   Right/Left Knee Right;Left   Right Knee Flexion 4/5    Right Knee Extension 4+/5   Left Knee Flexion 5/5   Left Knee Extension 5/5   Right/Left Ankle Right;Left   Right Ankle Dorsiflexion 5/5   Left Ankle Dorsiflexion 5/5     Flexibility   Soft Tissue Assessment /Muscle Length yes   Hamstrings B ~32 deg limited      Palpation   Patella mobility symmetrical, WNL   Palpation comment TTP along lateral portal site, Rt peroneals, lateral retinaculum     Transfers   Five time sit to stand comments  17.28 sec, no UE   Comments pain posterior knee      Ambulation/Gait   Ambulation/Gait Yes   Ambulation/Gait Assistance 6: Modified independent (Device/Increase time)   Ambulation Distance (Feet) 80 Feet   Assistive device L Axillary Crutch   Gait Pattern Step-through pattern;Lateral trunk lean to left;Decreased weight shift to right   Gait Comments verbal/visual cues provided to improve upright posture and weightbearing on RLE during ambulation.      High Level Balance   High Level Balance Comments SLS: Rt 10 sec x3 trials, Lt: 20+ sec                    [facility] Adult PT Treatment/Exercise - 04/05/16 0001      Exercises   Exercises Knee/Hip     Knee/Hip Exercises: Stretches   Gastroc Stretch Both;1 rep;30 seconds   Gastroc Stretch Limitations against wall      Knee/Hip Exercises: Seated   Hamstring Curl Right;1 set;10 reps   Hamstring Limitations blue TB    Sit to Sand 10 reps;1 set;without UE support;Other (comment)  vebral cues to improve weight shift                 PT Education - 04/05/16 1435    Education provided Yes   Education Details eval findings/POC; HEP; sit to stand technique; gait mechanics with Lt axillary crutch   Person(s) Educated Patient   Methods Explanation;Handout;Demonstration;Verbal cues   Comprehension Verbalized understanding;Returned demonstration          PT Short Term Goals - 04/05/16 1442      PT SHORT TERM GOAL #1   Title Pt will demo consistency and independence with  his HEP to improve strength and mobility.   Time 1   Period Weeks   Status New     PT SHORT TERM GOAL #2   Title Pt will demo improved balance evident by his ability to perform single  leg stance on the Rt for atleast 20 sec, 2/3 trials.    Time 2   Period Weeks   Status New     PT SHORT TERM GOAL #3   Title Pt will demo improved gait mechanics evident by his ability to ambulate atelast 50 ft with equal step length and without AD and trunk lean, to allow him to return back to work.    Time 2   Period Weeks   Status New           PT Long Term Goals - 04/05/16 1444      PT LONG TERM GOAL #1   Title Pt will demo improved functional strength evident by his ability to perform 5x sit to stand in less than 11 sec, with equal weight shift and without use of UE.    Time 4   Period Weeks   Status New     PT LONG TERM GOAL #2   Title Pt will demo improved BLE strength evident by scoring 5/5 MMT, to improve safety with functional tasks.    Time 4   Period Weeks   Status New     PT LONG TERM GOAL #3   Title Pt will report no greater than 2/10 pain with full return to daily acitivty, to allow him to tolerate return to work.    Time 4   Period Weeks   Status New               Plan - 04/05/16 1438    Clinical Impression Statement Kamren is a pleasant 45yo M referred to OPPT s/p Rt knee scope on 03/24/16. He arrives using Lt axillary crutch and reporting 5/10 pain. He demonstrates minimal swelling, and his patella/scar mobility is fairly decent.  He does exhibit RLE weakness and impaired balance and functional mobility secondary to pain. He performed sit to stand with weight shift to the Lt and I addressed this with him at the end of the session. Also reviewed gait pattern to improve his upright posture and encouraged him to use his AD if he is unable to ambulate without antalgic pattern at home. He does report pain along his posterior calf which his MD has been made aware of. No  warmth, redness or swelling is noted in the area. In addition, he reports vague symptoms of possible heaviness and numbness in his Rt foot when in dependent positions which is alleviated with elevation. Will continue to monitor these symptoms for possible vascular/neurological causes and refer out if necessary. Pt would benefit from skilled PT to address his limitations and facilitate quick return to work. All of the above was discussed with the pt and HEP was provided with pt able to return correct demonstration.   Rehab Potential Good   Clinical Impairments Affecting Rehab Potential young, eager to return to work    PT Frequency 2x / week  decreased to 1x/week as needed    PT Duration 4 weeks   PT Treatment/Interventions ADLs/Self Care Home Management;Cryotherapy;Gait training;Stair training;Functional mobility training;Therapeutic activities;Therapeutic exercise;Patient/family education;Neuromuscular re-education;Balance training;Manual techniques;Scar mobilization   PT Next Visit Plan gait training with Lt axillary crutch, or without crutch if able; functional LE strengthening; continue to monitor possible report of Rt foot numbness/heaviness   PT Home Exercise Plan seated hamstring curls 2x15, blue TB; sit to stand 2x10, gastroc stretch 3x30 sec    Recommended Other Services none    Consulted and Agree with Plan of Care Patient  Patient will benefit from skilled therapeutic intervention in order to improve the following deficits and impairments:  Abnormal gait, Decreased activity tolerance, Decreased mobility, Decreased safety awareness, Decreased strength, Impaired flexibility, Pain, Improper body mechanics, Impaired sensation, Increased muscle spasms  Visit Diagnosis: Pain in right knee  Other abnormalities of gait and mobility  Unsteadiness on feet  Other symptoms and signs involving the musculoskeletal system     Problem List Patient Active Problem List   Diagnosis Date  Noted  . Pain in joint, shoulder region 03/11/2014  . Shoulder weakness 03/11/2014  . Decreased range of motion of left shoulder 03/11/2014    3:05 PM,04/05/16 Marylyn IshiharaSara Kiser PT, DPT [facility] [phone]  [facility] [address] [place], [place], [address] Phone: [phone]   Fax:  [phone]  Name: Severiano GilbertJuan Scullin MRN: [id number] Date of Birth: 07/28/1970

## 2016-04-10 ENCOUNTER — Ambulatory Visit (HOSPITAL_COMMUNITY): Payer: BLUE CROSS/BLUE SHIELD | Admitting: Physical Therapy

## 2016-04-10 DIAGNOSIS — M25561 Pain in right knee: Secondary | ICD-10-CM | POA: Diagnosis not present

## 2016-04-10 DIAGNOSIS — R2681 Unsteadiness on feet: Secondary | ICD-10-CM

## 2016-04-10 DIAGNOSIS — R29898 Other symptoms and signs involving the musculoskeletal system: Secondary | ICD-10-CM

## 2016-04-10 DIAGNOSIS — R2689 Other abnormalities of gait and mobility: Secondary | ICD-10-CM

## 2016-04-10 NOTE — Therapy (Addendum)
[place], [place], [address] Phone: [phone]   Fax:  [phone]  Physical Therapy Treatment/Discharge   Patient Details  Name: Nathaniel Frye MRN: [id number] Date of Birth: 10-Oct-1970 Referring Provider: Christena Flake, MD  Encounter Date: 04/10/2016      PT End of Session - 04/10/16 0830    Visit Number 2   Number of Visits 8   Date for PT Re-Evaluation 04/19/16   Authorization Type BCBS   Authorization Time Period 04/05/16 to 05/05/16   PT Start Time 0817   PT Stop Time 0855   PT Time Calculation (min) 38 min   Activity Tolerance Patient tolerated treatment well   Behavior During Therapy [facility] for tasks assessed/performed      Past Medical History:  Diagnosis Date  . Anxiety   . Headache(784.0)   . Medical history non-contributory     Past Surgical History:  Procedure Laterality Date  . LABRAL REPAIR Left 03/02/2014   Procedure: Arthroscopy Left Shoulder with Open biceps tenodesis;  Surgeon: Nita Sells, MD;  Location: [place];  Service: Orthopedics;  Laterality: Left;  Left shoulder arthroscopy debridment labral tear, biceps tenotomy, open biceps tenodesis  . NO PAST SURGERIES      There were no vitals filed for this visit.      Subjective Assessment - 04/10/16 0819    Subjective Pt reports that things are much better from his last visit. He has been doing his exercises without trouble and hasn't had pain for several days now. Denies any numbness/tingling and only notes occasional popping in his knee joint with activity.    Pertinent History anxiety   Limitations Walking   How long can you sit comfortably? unlimited    How long can you stand comfortably? "10+ minutes probably"    How long can you walk comfortably? 10 minutes or so    Diagnostic tests Xray before surgery    Patient Stated Goals Regain use of Rt knee/leg   Currently in Pain? No/denies   Pain  Onset 1 to 4 weeks ago            [facility] PT Assessment - 04/10/16 0001      Assessment   Medical Diagnosis Rt knee scope   Referring Provider Christena Flake, MD   Onset Date/Surgical Date 03/24/16   Next MD Visit 05/06/16   Prior Therapy none      Precautions   Precautions None     Restrictions   Weight Bearing Restrictions No     Balance Screen   Has the patient fallen in the past 6 months No   Has the patient had a decrease in activity level because of a fear of falling?  No   Is the patient reluctant to leave their home because of a fear of falling?  No     Home [employer] residence   Additional Comments ~5 STE     Prior Function   Level of Independence Independent   Vocation Full time employment   Vocation Requirements dairy farm     Cognition   Overall Cognitive Status Within Functional Limits for tasks assessed     Observation/Other Assessments   Observations LEFS: 77/80   Focus on Therapeutic Outcomes (FOTO)  10% limited      Sensation   Light Touch Appears Intact   Additional Comments --     Strength   Right Hip Flexion 5/5  Right Hip Extension 5/5   Right Hip ABduction 5/5   Left Hip Flexion 5/5   Left Hip Extension 5/5   Left Hip ABduction 5/5   Right Knee Flexion 5/5   Right Knee Extension 5/5   Left Knee Flexion 5/5   Left Knee Extension 5/5   Right Ankle Dorsiflexion 5/5   Left Ankle Dorsiflexion 5/5     Flexibility   Soft Tissue Assessment /Muscle Length yes   Hamstrings --     Palpation   Patella mobility symmetrical, WNL   Palpation comment non tender with palpation     Transfers   Five time sit to stand comments  8.3 sec, no UE   Comments --     Ambulation/Gait   Ambulation/Gait Yes   Ambulation/Gait Assistance 7: Independent   Ambulation Distance (Feet) 100 Feet   Assistive device None   Gait Pattern --   Gait Comments --     High Level Balance   High Level Balance Comments 20+ sec on  each, foam and solid surface                      [facility] Adult PT Treatment/Exercise - 04/10/16 0001      Knee/Hip Exercises: Standing   Lateral Step Up Right;2 sets;15 reps;Hand Hold: 1;Step Height: 6"   Lateral Step Up Limitations step down    Forward Step Up 15 reps;Hand Hold: 0;Step Height: 6";Right;2 sets  2nd set with foam pad, 2 Hand hold    SLS solid Rt: 25 sec Lt: 25 sec  (best of 2 trials); Foam Rt: 20 sec, Lt: 20 sec    SLS with Vectors Rt SLS on foam with perturbations x20 reps    Other Standing Knee Exercises half kneel to stand 2x10 each    Other Standing Knee Exercises tandem walking on foam beam x2 RT; forward walking on foam beam x2 RT     Knee/Hip Exercises: Seated   Sit to Sand 1 set;15 reps;without UE support                PT Education - 04/10/16 0827    Education provided Yes   Education Details technique with therex; noted progress; updated HEP   Person(s) Educated Patient   Methods Explanation   Comprehension Verbalized understanding          PT Short Term Goals - 04/10/16 0900      PT SHORT TERM GOAL #1   Title Pt will demo consistency and independence with his HEP to improve strength and mobility.   Time 1   Period Weeks   Status Achieved     PT SHORT TERM GOAL #2   Title Pt will demo improved balance evident by his ability to perform single leg stance on the Rt for atleast 20 sec, 2/3 trials.    Time 2   Period Weeks   Status Achieved     PT SHORT TERM GOAL #3   Title Pt will demo improved gait mechanics evident by his ability to ambulate atelast 50 ft with equal step length and without AD and trunk lean, to allow him to return back to work.    Time 2   Period Weeks   Status Achieved           PT Long Term Goals - 04/10/16 0901      PT LONG TERM GOAL #1   Title Pt will demo improved functional strength evident by his ability to  perform 5x sit to stand in less than 11 sec, with equal weight shift and without use  of UE.    Time 4   Period Weeks   Status Achieved     PT LONG TERM GOAL #2   Title Pt will demo improved BLE strength evident by scoring 5/5 MMT, to improve safety with functional tasks.    Time 4   Period Weeks   Status Achieved     PT LONG TERM GOAL #3   Title Pt will report no greater than 2/10 pain with full return to daily acitivty, to allow him to tolerate return to work.    Time 4   Period Weeks   Status Achieved               Plan - 04/10/16 0857    Clinical Impression Statement Nathaniel Frye arrives today having made excellent progress since his inital evaluation. He demonstrates improved Rt knee strength up to 5/5 MMT as well as improved tenderness with palpation, SLS up to 20 sec on stable and unstable surfaces, and improved 5x sit to stand without postural deviations. He has reportedly been able to return to all activity at home without difficulty or pain, scoring 77/80 on LEFS and only 10% limitation on FOTO. He is being discharged today with independence with his HEP, meeting all rehab goals and being pleased with his progress. I encouraged him to resume all daily activity and continue with HEP. He verbalized understanding at this time.    Rehab Potential Good   Clinical Impairments Affecting Rehab Potential young, eager to return to work    PT Frequency 2x / week  decreased to 1x/week as needed    PT Duration 4 weeks   PT Treatment/Interventions ADLs/Self Care Home Management;Cryotherapy;Gait training;Stair training;Functional mobility training;Therapeutic activities;Therapeutic exercise;Patient/family education;Neuromuscular re-education;Balance training;Manual techniques;Scar mobilization   PT Next Visit Plan d/c    PT Home Exercise Plan seated hamstring curls 2x15, blue TB; sit to stand 2x10, gastroc stretch 3x30 sec    Recommended Other Services none    Consulted and Agree with Plan of Care Patient      Patient will benefit from skilled therapeutic intervention in  order to improve the following deficits and impairments:  Abnormal gait, Decreased activity tolerance, Decreased mobility, Decreased safety awareness, Decreased strength, Impaired flexibility, Pain, Improper body mechanics, Impaired sensation, Increased muscle spasms  Visit Diagnosis: Pain in right knee  Other abnormalities of gait and mobility  Unsteadiness on feet  Other symptoms and signs involving the musculoskeletal system    PHYSICAL THERAPY DISCHARGE SUMMARY  Visits from Start of Care: 2  Current functional level related to goals / functional outcomes: 5/5 strength, single leg balance 20+ sec on foam and stable surfaces/symmetrical; pain free with activity; FOTO 10% limited; full/pain free ROM   Remaining deficits: Pain/stiffness with squatting to pick up object from floor   Education / Equipment: continue with HEP Plan: Patient agrees to discharge.  Patient goals were met. Patient is being discharged due to meeting the stated rehab goals.  ?????            Problem List Patient Active Problem List   Diagnosis Date Noted  . Pain in joint, shoulder region 03/11/2014  . Shoulder weakness 03/11/2014  . Decreased range of motion of left shoulder 03/11/2014    9:21 AM,04/10/16 Elly Modena PT, DPT [facility] [place] [address] [place], [place], [address]  Phone: [phone]   Fax:  [phone]  Name: Nathaniel Frye MRN: [id number] Date of Birth: May 07, 1971    *addendum to close episode of care  10:41 AM,04/10/16 Elly Modena PT, [facility] [phone]

## 2016-04-12 ENCOUNTER — Encounter (HOSPITAL_COMMUNITY): Payer: Self-pay | Admitting: Physical Therapy

## 2016-04-17 ENCOUNTER — Encounter (HOSPITAL_COMMUNITY): Payer: Self-pay | Admitting: Physical Therapy

## 2016-04-19 ENCOUNTER — Encounter (HOSPITAL_COMMUNITY): Payer: Self-pay

## 2016-04-24 ENCOUNTER — Encounter (HOSPITAL_COMMUNITY): Payer: Self-pay | Admitting: Physical Therapy

## 2016-04-27 ENCOUNTER — Encounter (HOSPITAL_COMMUNITY): Payer: Self-pay

## 2016-05-02 ENCOUNTER — Encounter (HOSPITAL_COMMUNITY): Payer: Self-pay | Admitting: Physical Therapy

## 2016-05-04 ENCOUNTER — Encounter (HOSPITAL_COMMUNITY): Payer: Self-pay | Admitting: Physical Therapy

## 2017-06-17 ENCOUNTER — Emergency Department (HOSPITAL_COMMUNITY)
Admission: EM | Admit: 2017-06-17 | Discharge: 2017-06-18 | Disposition: A | Discharge status: Home / Self Care | Payer: BLUE CROSS/BLUE SHIELD | Attending: Emergency Medicine | Admitting: Emergency Medicine

## 2017-06-17 ENCOUNTER — Emergency Department (HOSPITAL_COMMUNITY): Payer: BLUE CROSS/BLUE SHIELD

## 2017-06-17 ENCOUNTER — Other Ambulatory Visit: Payer: Self-pay

## 2017-06-17 ENCOUNTER — Encounter (HOSPITAL_COMMUNITY): Payer: Self-pay | Admitting: Emergency Medicine

## 2017-06-17 DIAGNOSIS — Z79899 Other long term (current) drug therapy: Secondary | ICD-10-CM | POA: Insufficient documentation

## 2017-06-17 DIAGNOSIS — F430 Acute stress reaction: Secondary | ICD-10-CM | POA: Diagnosis not present

## 2017-06-17 DIAGNOSIS — R0789 Other chest pain: Secondary | ICD-10-CM | POA: Insufficient documentation

## 2017-06-17 LAB — BASIC METABOLIC PANEL
ANION GAP: 7 (ref 5–15)
BUN: 12 mg/dL (ref 6–20)
CHLORIDE: 104 mmol/L (ref 101–111)
CO2: 24 mmol/L (ref 22–32)
Calcium: 9.6 mg/dL (ref 8.9–10.3)
Creatinine, Ser: 0.78 mg/dL (ref 0.61–1.24)
GFR calc Af Amer: >60 mL/min (ref >60)
GFR calc non Af Amer: >60 mL/min (ref >60)
GLUCOSE: 119 mg/dL — AB (ref 65–99)
Potassium: 3.8 mmol/L (ref 3.5–5.1)
Sodium: 135 mmol/L (ref 135–145)

## 2017-06-17 LAB — TROPONIN I: Troponin I: <0.03 ng/mL (ref <0.03)

## 2017-06-17 LAB — CBC
HEMATOCRIT: 45.5 % (ref 39.0–52.0)
HEMOGLOBIN: 15.3 g/dL (ref 13.0–17.0)
MCH: 30.8 pg (ref 26.0–34.0)
MCHC: 33.6 g/dL (ref 30.0–36.0)
MCV: 91.5 fL (ref 78.0–100.0)
Platelets: 233 10*3/uL (ref 150–400)
RBC: 4.97 MIL/uL (ref 4.22–5.81)
RDW: 12.3 % (ref 11.5–15.5)
WBC: 12.2 10*3/uL — ABNORMAL HIGH (ref 4.0–10.5)

## 2017-06-17 NOTE — ED Triage Notes (Signed)
Pt reports cp in center of chest that radiates to left arm, also having nausea.  This started around 1900. States he got really mad about something and symptoms started shortly after

## 2017-06-17 NOTE — ED Provider Notes (Signed)
[facility] EMERGENCY DEPARTMENT Provider Note   CSN: [id number] Arrival date & time: 06/17/17  2145     History   Chief Complaint Chief Complaint  Patient presents with  . Chest Pain    HPI Nathaniel Frye is a 46 y.o. male.  Patient is a 45 year old male with history of anxiety presenting for evaluation of chest discomfort, palpitations, and nausea.  This began during an argument with a family member at home.  He denies any difficulty breathing.  He has a medication that he takes at night to help him relax he did take prior to coming here and he seems to be feeling somewhat better.  He continues to feel nauseated but the chest pain has markedly improved.  He has no prior cardiac history and has no cardiac risk factors.   The history is provided by the patient.  Chest Pain   This is a new problem. Episode onset: 7 PM. The problem occurs constantly. The problem has been gradually improving. The pain is associated with an emotional upset. The pain is present in the substernal region. The pain is moderate. The quality of the pain is described as pressure-like. The pain does not radiate. Treatments tried: anxiety pill. The treatment provided moderate relief.    Past Medical History:  Diagnosis Date  . Anxiety   . Headache(784.0)   . Medical history non-contributory     Patient Active Problem List   Diagnosis Date Noted  . Pain in joint, shoulder region 03/11/2014  . Shoulder weakness 03/11/2014  . Decreased range of motion of left shoulder 03/11/2014    Past Surgical History:  Procedure Laterality Date  . LABRAL REPAIR Left 03/02/2014   Procedure: Arthroscopy Left Shoulder with Open biceps tenodesis;  Surgeon: Mable Paris, MD;  Location: [facility];  Service: Orthopedics;  Laterality: Left;  Left shoulder arthroscopy debridment labral tear, biceps tenotomy, open biceps tenodesis  . NO PAST SURGERIES         Home Medications    Prior to  Admission medications   Medication Sig Start Date End Date Taking? Authorizing Provider  acetaminophen (TYLENOL) 500 MG tablet Take 1,000 mg by mouth every 6 (six) hours as needed for moderate pain or fever.    [provider]  ciprofloxacin (CIPRO) 500 MG tablet Take 1 tablet (500 mg total) by mouth 2 (two) times daily. 11/29/14   [name], MD  dicyclomine (BENTYL) 20 MG tablet Take 20 mg by mouth 4 (four) times daily.    [provider]  docusate sodium (COLACE) 100 MG capsule Take 1 capsule (100 mg total) by mouth 3 (three) times daily as needed. Patient not taking: Reported on 04/05/2016 03/02/14   Jiles Harold, PA-C  HYDROcodone-acetaminophen (NORCO/VICODIN) 5-325 MG per tablet Take 2 tablets by mouth every 4 (four) hours as needed. Patient not taking: Reported on 04/05/2016 11/29/14   [name], MD  metroNIDAZOLE (FLAGYL) 500 MG tablet Take 1 tablet (500 mg total) by mouth 2 (two) times daily. 11/29/14   [name], MD  Multiple Vitamin (MULTIVITAMIN WITH MINERALS) TABS tablet Take 1 tablet by mouth daily.    [provider]  ondansetron (ZOFRAN ODT) 4 MG disintegrating tablet Take 1 tablet (4 mg total) by mouth every 8 (eight) hours as needed for nausea. 11/29/14   [name], MD  oxyCODONE-acetaminophen (ROXICET) 5-325 MG per tablet Take 1-2 tablets by mouth every 4 (four) hours as needed for severe pain. Patient not taking: Reported on 04/05/2016  03/02/14   Jiles HaroldLaliberte, Danielle, PA-C  PARoxetine (PAXIL) 20 MG tablet Take 20 mg by mouth at bedtime.    [provider]    Family History No family history on file.  Social History Social History   Tobacco Use  . Smoking status: Never Smoker  . Smokeless tobacco: Never Used  Substance Use Topics  . Alcohol use: Yes    Comment: occ  . Drug use: No     Allergies   Patient has no known allergies.   Review of Systems Review of Systems  Cardiovascular: Positive for chest pain.  All other  systems reviewed and are negative.    Physical Exam Updated Vital Signs BP (!) 160/94 (BP Location: Right Arm)   Pulse 95   Temp 98.8 F (37.1 C) (Oral)   Resp 18   Ht 5\' 9"  (1.753 m)   Wt 86.2 kg (190 lb)   SpO2 100%   BMI 28.06 kg/m   Physical Exam  Constitutional: He is oriented to person, place, and time. He appears well-developed and well-nourished. No distress.  HENT:  Head: Normocephalic and atraumatic.  Mouth/Throat: Oropharynx is clear and moist.  Neck: Normal range of motion. Neck supple.  Cardiovascular: Normal rate and regular rhythm. Exam reveals no friction rub.  No murmur heard. Pulmonary/Chest: Effort normal and breath sounds normal. No respiratory distress. He has no wheezes. He has no rales.  Abdominal: Soft. Bowel sounds are normal. He exhibits no distension. There is no tenderness.  Musculoskeletal: Normal range of motion. He exhibits no edema.  Neurological: He is alert and oriented to person, place, and time. Coordination normal.  Skin: Skin is warm and dry. He is not diaphoretic.  Nursing note and vitals reviewed.    ED Treatments / Results  Labs (all labs ordered are listed, but only abnormal results are displayed) Labs Reviewed  BASIC METABOLIC PANEL - Abnormal; Notable for the following components:      Result Value   Glucose, Bld 119 (*)    All other components within normal limits  CBC - Abnormal; Notable for the following components:   WBC 12.2 (*)    All other components within normal limits  TROPONIN I    EKG  EKG Interpretation  Date/Time:  Sunday June 17 2017 22:15:20 EST Ventricular Rate:  95 PR Interval:  140 QRS Duration: 90 QT Interval:  356 QTC Calculation: 447 R Axis:   47 Text Interpretation:  Normal sinus rhythm with sinus arrhythmia Normal ECG No significant change since last tracing Confirmed by Doug SouJacubowitz, Sam [phone]([id number]) on 06/17/2017 10:18:32 PM       Radiology Dg Chest 2 View  Result Date:  06/17/2017 CLINICAL DATA:  Central chest pain radiating to the left arm. Nausea. EXAM: CHEST  2 VIEW COMPARISON:  08/24/2011 FINDINGS: Shallow inspiration. Linear atelectasis in the lung bases. Heart size and pulmonary vascularity are normal for technique. No focal consolidation or airspace disease in the lungs. No blunting of costophrenic angles. No pneumothorax. IMPRESSION: Shallow inspiration with linear atelectasis in the lung bases. Electronically Signed   By: Burman NievesWilliam  Stevens M.D.   On: 06/17/2017 23:09    Procedures Procedures (including critical care time)  Medications Ordered in ED Medications - No data to display   Initial Impression / Assessment and Plan / ED Course  I have reviewed the triage vital signs and the nursing notes.  Pertinent labs & imaging results that were available during my care of the patient were reviewed by me and  considered in my medical decision making (see chart for details).  Patient presents with chest pain and nausea that began during an argument with a family member.  His symptoms sound most consistent with an emotional stress etiology.  His EKG is unchanged and troponin is negative with symptoms starting 5 hours prior to presentation.  I do not feel as though any further workup is indicated.  He will be discharged, to return as needed for any problems.  Final Clinical Impressions(s) / ED Diagnoses   Final diagnoses:  None    ED Discharge Orders    None       Geoffery Lyonselo, Cedar Roseman, MD 06/18/17 0002

## 2017-06-18 NOTE — Discharge Instructions (Signed)
Continue your medications as before. ° °Return to the emergency department if symptoms significantly worsen or change. °

## 2022-07-19 ENCOUNTER — Ambulatory Visit
Admission: EM | Admit: 2022-07-19 | Discharge: 2022-07-19 | Disposition: A | Discharge status: Home / Self Care | Payer: BLUE CROSS/BLUE SHIELD | Attending: Nurse Practitioner | Admitting: Nurse Practitioner

## 2022-07-19 DIAGNOSIS — J0140 Acute pansinusitis, unspecified: Secondary | ICD-10-CM

## 2022-07-19 MED ORDER — DOXYCYCLINE HYCLATE 100 MG PO CAPS: 100.0000 mg | ORAL_CAPSULE | Freq: Two times a day (BID) | ORAL | 0 refills | Status: AC

## 2022-07-19 MED ORDER — FLUTICASONE PROPIONATE 50 MCG/ACT NA SUSP: 2.0000 | Freq: Every day | NASAL | 0 refills | Status: AC

## 2022-07-19 MED ORDER — PREDNISONE 20 MG PO TABS: 40.0000 mg | ORAL_TABLET | Freq: Every day | ORAL | 0 refills | Status: AC

## 2022-07-19 NOTE — ED Provider Notes (Signed)
[facility]    CSN: [id number] Arrival date & time: 07/19/22  7829      History   Chief Complaint Chief Complaint  Patient presents with   Headache    HPI Nathaniel Frye is a 51 y.o. male.   The history is provided by the patient and a relative.   Patient presents for complaints of headache, sinus pressure, and nasal congestion.  Patient reports a history of chronic sinusitis.  Patient reports that he was treated for a sinus infection approximately 1 month ago, symptoms improved, but over the past 6 to 7 days, his symptoms worsen.  Patient reports that his headache is so intense, that it causes him to vomit after he is eating.  He reports that he is seeing ENT, and was prescribed levocetirizine and ibuprofen for his symptoms.  Patient's family reports that he did try to get in with them for an appointment today, but they were booked.  Patient's family denies fever, chills, sore throat, ear pain.  Past Medical History:  Diagnosis Date   Anxiety    Headache(784.0)    Medical history non-contributory     Patient Active Problem List   Diagnosis Date Noted   Pain in joint, shoulder region 03/11/2014   Shoulder weakness 03/11/2014   Decreased range of motion of left shoulder 03/11/2014    Past Surgical History:  Procedure Laterality Date   LABRAL REPAIR Left 03/02/2014   Procedure: Arthroscopy Left Shoulder with Open biceps tenodesis;  Surgeon: Mable Paris, MD;  Location: [facility];  Service: Orthopedics;  Laterality: Left;  Left shoulder arthroscopy debridment labral tear, biceps tenotomy, open biceps tenodesis   NO PAST SURGERIES         Home Medications    Prior to Admission medications   Medication Sig Start Date End Date Taking? Authorizing Provider  doxycycline (VIBRAMYCIN) 100 MG capsule Take 1 capsule (100 mg total) by mouth 2 (two) times daily for 7 days. 07/19/22 07/26/22 Yes Rachit Grim-Warren, Sadie Haber, NP  fluticasone  (FLONASE) 50 MCG/ACT nasal spray Place 2 sprays into both nostrils daily. 07/19/22  Yes Mateus Rewerts-Warren, Sadie Haber, NP  predniSONE (DELTASONE) 20 MG tablet Take 2 tablets (40 mg total) by mouth daily with breakfast for 5 days. 07/19/22 07/24/22 Yes Albert Devaul-Warren, Sadie Haber, NP  acetaminophen (TYLENOL) 500 MG tablet Take 1,000 mg by mouth every 6 (six) hours as needed for moderate pain or fever.    [provider]  ciprofloxacin (CIPRO) 500 MG tablet Take 1 tablet (500 mg total) by mouth 2 (two) times daily. 11/29/14   [name], MD  dicyclomine (BENTYL) 20 MG tablet Take 20 mg by mouth 4 (four) times daily.    [provider]  docusate sodium (COLACE) 100 MG capsule Take 1 capsule (100 mg total) by mouth 3 (three) times daily as needed. Patient not taking: Reported on 04/05/2016 03/02/14   Jiles Harold, PA-C  HYDROcodone-acetaminophen (NORCO/VICODIN) 5-325 MG per tablet Take 2 tablets by mouth every 4 (four) hours as needed. Patient not taking: Reported on 04/05/2016 11/29/14   [name], MD  metroNIDAZOLE (FLAGYL) 500 MG tablet Take 1 tablet (500 mg total) by mouth 2 (two) times daily. 11/29/14   [name], MD  Multiple Vitamin (MULTIVITAMIN WITH MINERALS) TABS tablet Take 1 tablet by mouth daily.    [provider]  ondansetron (ZOFRAN ODT) 4 MG disintegrating tablet Take 1 tablet (4 mg total) by mouth every 8 (eight) hours as needed for nausea. 11/29/14  Tanna Furry, MD  oxyCODONE-acetaminophen (ROXICET) 5-325 MG per tablet Take 1-2 tablets by mouth every 4 (four) hours as needed for severe pain. Patient not taking: Reported on 04/05/2016 03/02/14   Grier Mitts, PA-C  PARoxetine (PAXIL) 20 MG tablet Take 20 mg by mouth at bedtime.    [provider]    Family History History reviewed. No pertinent family history.  Social History Social History   Tobacco Use   Smoking status: Never   Smokeless tobacco: Never  Substance Use Topics   Alcohol use:  Yes    Comment: occ   Drug use: No     Allergies   Penicillins   Review of Systems Review of Systems Per HPI  Physical Exam Triage Vital Signs ED Triage Vitals  Enc Vitals Group     BP 07/19/22 1023 121/79     Pulse Rate 07/19/22 1023 65     Resp 07/19/22 1023 16     Temp 07/19/22 1023 98.3 F (36.8 C)     Temp Source 07/19/22 1023 Oral     SpO2 07/19/22 1023 97 %     Weight --      Height --      Head Circumference --      Peak Flow --      Pain Score 07/19/22 1022 10     Pain Loc --      Pain Edu? --      Excl. in [place]? --    No data found.  Updated Vital Signs BP 121/79 (BP Location: Right Arm)   Pulse 65   Temp 98.3 F (36.8 C) (Oral)   Resp 16   SpO2 97%   Visual Acuity Right Eye Distance:   Left Eye Distance:   Bilateral Distance:    Right Eye Near:   Left Eye Near:    Bilateral Near:     Physical Exam Vitals and nursing note reviewed.  Constitutional:      General: He is not in acute distress.    Appearance: He is well-developed.  HENT:     Head: Normocephalic.     Right Ear: Tympanic membrane, ear canal and external ear normal.     Left Ear: Tympanic membrane, ear canal and external ear normal.     Nose: Congestion present.     Right Turbinates: Enlarged and swollen.     Left Turbinates: Enlarged and swollen.     Right Sinus: Maxillary sinus tenderness and frontal sinus tenderness present.     Left Sinus: Maxillary sinus tenderness and frontal sinus tenderness present.     Mouth/Throat:     Lips: Pink.     Mouth: Mucous membranes are moist.     Pharynx: Oropharynx is clear. Uvula midline. No pharyngeal swelling or posterior oropharyngeal erythema.  Eyes:     Extraocular Movements: Extraocular movements intact.     Pupils: Pupils are equal, round, and reactive to light.  Cardiovascular:     Rate and Rhythm: Regular rhythm.     Heart sounds: Normal heart sounds.  Pulmonary:     Effort: Pulmonary effort is normal. No respiratory  distress.     Breath sounds: Normal breath sounds. No stridor. No wheezing, rhonchi or rales.  Abdominal:     General: Bowel sounds are normal.     Palpations: Abdomen is soft.     Tenderness: There is no abdominal tenderness.  Musculoskeletal:     Cervical back: Normal range of motion.  Lymphadenopathy:  Cervical: No cervical adenopathy.  Skin:    General: Skin is warm and dry.  Neurological:     General: No focal deficit present.     Mental Status: He is alert and oriented to person, place, and time.  Psychiatric:        Mood and Affect: Mood normal.        Speech: Speech normal.        Behavior: Behavior normal.      UC Treatments / Results  Labs (all labs ordered are listed, but only abnormal results are displayed) Labs Reviewed - No data to display  EKG   Radiology No results found.  Procedures Procedures (including critical care time)  Medications Ordered in UC Medications - No data to display  Initial Impression / Assessment and Plan / UC Course  I have reviewed the triage vital signs and the nursing notes.  Pertinent labs & imaging results that were available during my care of the patient were reviewed by me and considered in my medical decision making (see chart for details).  Patient is well-appearing, he is in no acute distress, vital signs are stable.  Symptoms are consistent with acute pansinusitis.  Patient has both moderate maxillary and frontal sinus tenderness.  For his chronic sinusitis, prednisone 40 mg was prescribed to help with inflammation.  Fluticasone 50 mcg nasal spray was also prescribed, along with doxycycline 100 mg.  Discussed with the patient and his family that he needs to follow-up with ENT for further evaluation of the chronic sinusitis.  Advised that patient may need maintenance medications for control of his recurring sinus symptoms.  Reported care recommendations were provided to the patient and his family.  Patient's family  verbalizes understanding.  All questions were answered.  Patient is stable for discharge.   Final Clinical Impressions(s) / UC Diagnoses   Final diagnoses:  Acute pansinusitis, recurrence not specified     Discharge Instructions      Take medication as directed. Increase fluids and get plenty of rest. May take over-the-counter ibuprofen or Tylenol as needed for pain, fever, or general discomfort. Recommend normal saline nasal spray to help with nasal congestion throughout the day. If your symptoms fail to improve after this treatment, please follow-up with your primary care physician or with ENT for further evaluation. Follow-up as needed.     ED Prescriptions     Medication Sig Dispense Auth. Provider   predniSONE (DELTASONE) 20 MG tablet Take 2 tablets (40 mg total) by mouth daily with breakfast for 5 days. 10 tablet Willem Klingensmith-Warren, Alda Lea, NP   doxycycline (VIBRAMYCIN) 100 MG capsule Take 1 capsule (100 mg total) by mouth 2 (two) times daily for 7 days. 14 capsule Niomi Valent-Warren, Alda Lea, NP   fluticasone (FLONASE) 50 MCG/ACT nasal spray Place 2 sprays into both nostrils daily. 16 g Rolinda Impson-Warren, Alda Lea, NP      PDMP not reviewed this encounter.   Tish Men, NP 07/19/22 1057

## 2022-07-19 NOTE — Discharge Instructions (Addendum)
Take medication as directed. Increase fluids and get plenty of rest. May take over-the-counter ibuprofen or Tylenol as needed for pain, fever, or general discomfort. Recommend normal saline nasal spray to help with nasal congestion throughout the day. If your symptoms fail to improve after this treatment, please follow-up with your primary care physician or with ENT for further evaluation. Follow-up as needed.

## 2022-07-19 NOTE — ED Triage Notes (Signed)
Pt reports he was treated for sinus infection and was good, he started feeling the sinus pressure and headache 6 days ago. Reports he is vomiting after eating due to the pressure in his sinus and forehead,

## 2023-01-06 ENCOUNTER — Ambulatory Visit
Admission: EM | Admit: 2023-01-06 | Discharge: 2023-01-06 | Disposition: A | Discharge status: Home / Self Care | Payer: Self-pay | Attending: Family Medicine | Admitting: Family Medicine

## 2023-01-06 ENCOUNTER — Other Ambulatory Visit: Payer: Self-pay

## 2023-01-06 ENCOUNTER — Encounter: Payer: Self-pay | Admitting: Emergency Medicine

## 2023-01-06 DIAGNOSIS — R200 Anesthesia of skin: Secondary | ICD-10-CM

## 2023-01-06 DIAGNOSIS — J0101 Acute recurrent maxillary sinusitis: Secondary | ICD-10-CM

## 2023-01-06 LAB — POCT FASTING CBG KUC MANUAL ENTRY: POCT Glucose (KUC): 118 mg/dL — AB (ref 70–99)

## 2023-01-06 MED ORDER — PREDNISONE 20 MG PO TABS: 40.0000 mg | ORAL_TABLET | Freq: Every day | ORAL | 0 refills | Status: AC

## 2023-01-06 MED ORDER — LEVOFLOXACIN 750 MG PO TABS: 750.0000 mg | ORAL_TABLET | Freq: Every day | ORAL | 0 refills | Status: AC

## 2023-01-06 NOTE — ED Triage Notes (Addendum)
Pt reports bilateral facial numbness and generalized weakness x1 week. Pt reports works 15 hours and is inquiring if it could stress related.  Denies any visual changes. Pt alert and oriented. Steady gait.   Interpreter offered. Pt declined.

## 2023-01-06 NOTE — ED Notes (Signed)
Pt denies eating breakfast or having anything to drink this morning.

## 2023-01-08 NOTE — ED Provider Notes (Signed)
[facility]   [id number] 01/06/23 Arrival Time: 4098  ASSESSMENT & PLAN:  1. Acute recurrent maxillary sinusitis   2. Rt facial numbness   3. Lt facial numbness    Normal neurological exam. Likely related to sinus infection; reports similar in the past. Begin: Meds ordered this encounter  Medications   levofloxacin (LEVAQUIN) 750 MG tablet    Sig: Take 1 tablet (750 mg total) by mouth daily.    Dispense:  5 tablet    Refill:  0   predniSONE (DELTASONE) 20 MG tablet    Sig: Take 2 tablets (40 mg total) by mouth daily.    Dispense:  10 tablet    Refill:  0    Discussed typical duration of symptoms. OTC symptom care as needed. Ensure adequate fluid intake and rest.   Follow-up Information      Emergency Department at [facility].   Specialty: Emergency Medicine Why: If symptoms worsen in any way. Contact information: [address] [id number] [place] [address] [phone]                Reviewed expectations re: course of current medical issues. Questions answered. Outlined signs and symptoms indicating need for more acute intervention. Patient verbalized understanding. After Visit Summary given.   SUBJECTIVE: History from: patient.  Nathaniel Frye is a 52 y.o. male who presents bilateral facial pain/sinus pressure and bilateral facial numbness ; gradual progression over past week. Very congested. Fatigued. No extremity sensation changes or weakness. Denies specific headache. Normal vision. Normal speech. Pt reports works 15 hours and is inquiring if it could stress related. Interpreter offered. Pt declined.  Social History   Tobacco Use  Smoking Status Never  Smokeless Tobacco Never   OBJECTIVE:  Vitals:   01/06/23 0826  BP: 126/85  Pulse: 67  Resp: 20  Temp: 98 F (36.7 C)  TempSrc: Oral  SpO2: 97%    General appearance: alert; no distress HEENT: nasal congestion; clear runny nose;  throat irritation secondary to post-nasal drainage; bilateral maxillary tenderness to palpation; turbinates boggy Neck: supple without LAD; trachea midline Lungs: unlabored respirations, symmetrical air entry; cough: absent; no respiratory distress Skin: warm and dry Neuro: CN 2-12 grossly intact; normal speech; normal extremity strength and sensation Psychological: alert and cooperative; normal mood and affect  Allergies  Allergen Reactions   Penicillins     Past Medical History:  Diagnosis Date   Anxiety    Headache(784.0)    Medical history non-contributory    History reviewed. No pertinent family history. Social History   Socioeconomic History   Marital status: Married    Spouse name: Not on file   Number of children: Not on file   Years of education: Not on file   Highest education level: Not on file  Occupational History   Not on file  Tobacco Use   Smoking status: Never   Smokeless tobacco: Never  Substance and Sexual Activity   Alcohol use: Yes    Comment: occ   Drug use: No   Sexual activity: Yes  Other Topics Concern   Not on file  Social History Narrative   Not on file   Social Determinants of Health   Financial Resource Strain: Not on file  Food Insecurity: Not on file  Transportation Needs: Not on file  Physical Activity: Not on file  Stress: Not on file  Social Connections: Not on file  Intimate Partner Violence: Not on file  Mardella Layman, MD 01/08/23 [phone]
